# Patient Record
Sex: Female | Born: 1940
Health system: Southern US, Community
[De-identification: ages and names within clinical notes are randomized; demographics above are authoritative.]

## PROBLEM LIST (undated history)

## (undated) DIAGNOSIS — D869 Sarcoidosis, unspecified: Secondary | ICD-10-CM

## (undated) DIAGNOSIS — R42 Dizziness and giddiness: Secondary | ICD-10-CM

## (undated) DIAGNOSIS — R0600 Dyspnea, unspecified: Secondary | ICD-10-CM

## (undated) DIAGNOSIS — R55 Syncope and collapse: Secondary | ICD-10-CM

## (undated) DIAGNOSIS — G9389 Other specified disorders of brain: Secondary | ICD-10-CM

## (undated) DIAGNOSIS — M1990 Unspecified osteoarthritis, unspecified site: Secondary | ICD-10-CM

## (undated) DIAGNOSIS — K219 Gastro-esophageal reflux disease without esophagitis: Secondary | ICD-10-CM

## (undated) DIAGNOSIS — E785 Hyperlipidemia, unspecified: Secondary | ICD-10-CM

## (undated) DIAGNOSIS — I10 Essential (primary) hypertension: Secondary | ICD-10-CM

## (undated) DIAGNOSIS — N183 Chronic kidney disease, stage 3 unspecified: Secondary | ICD-10-CM

## (undated) HISTORY — DX: Chronic kidney disease, stage 3 unspecified: N18.30

## (undated) HISTORY — DX: Dizziness and giddiness: R42

## (undated) HISTORY — PX: TONSILLECTOMY: SUR1361

## (undated) HISTORY — DX: Syncope and collapse: R55

## (undated) HISTORY — DX: Essential (primary) hypertension: I10

## (undated) HISTORY — DX: Sarcoidosis, unspecified: D86.9

## (undated) HISTORY — PX: ABDOMINAL HYSTERECTOMY: SHX81

## (undated) HISTORY — PX: RADICAL HYSTERECTOMY WITH TRANSPOSITION OF OVARIES: SHX6222

## (undated) HISTORY — DX: Hyperlipidemia, unspecified: E78.5

## (undated) HISTORY — DX: Unspecified osteoarthritis, unspecified site: M19.90

## (undated) HISTORY — PX: SHOULDER ARTHROSCOPY: SHX128

## (undated) HISTORY — DX: Other specified disorders of brain: G93.89

## (undated) HISTORY — DX: Gastro-esophageal reflux disease without esophagitis: K21.9

## (undated) HISTORY — PX: KNEE ARTHROSCOPY: SUR90

---

## 1997-06-20 ENCOUNTER — Ambulatory Visit (HOSPITAL_COMMUNITY): Admission: RE | Admit: 1997-06-20 | Discharge: 1997-06-20 | Payer: Self-pay | Admitting: *Deleted

## 1998-02-17 DIAGNOSIS — I499 Cardiac arrhythmia, unspecified: Secondary | ICD-10-CM

## 1998-02-17 HISTORY — DX: Cardiac arrhythmia, unspecified: I49.9

## 1998-02-20 ENCOUNTER — Emergency Department (HOSPITAL_COMMUNITY): Admission: EM | Admit: 1998-02-20 | Discharge: 1998-02-20 | Payer: Self-pay | Admitting: Emergency Medicine

## 1998-09-28 ENCOUNTER — Other Ambulatory Visit: Admission: RE | Admit: 1998-09-28 | Discharge: 1998-09-28 | Payer: Self-pay | Admitting: Obstetrics & Gynecology

## 1999-10-24 ENCOUNTER — Other Ambulatory Visit: Admission: RE | Admit: 1999-10-24 | Discharge: 1999-10-24 | Payer: Self-pay | Admitting: Obstetrics & Gynecology

## 1999-10-25 ENCOUNTER — Other Ambulatory Visit: Admission: RE | Admit: 1999-10-25 | Discharge: 1999-10-25 | Payer: Self-pay | Admitting: Obstetrics & Gynecology

## 1999-10-25 ENCOUNTER — Encounter (INDEPENDENT_AMBULATORY_CARE_PROVIDER_SITE_OTHER): Payer: Self-pay

## 2000-09-30 ENCOUNTER — Other Ambulatory Visit: Admission: RE | Admit: 2000-09-30 | Discharge: 2000-09-30 | Payer: Self-pay | Admitting: Obstetrics and Gynecology

## 2001-05-22 ENCOUNTER — Ambulatory Visit (HOSPITAL_COMMUNITY): Admission: RE | Admit: 2001-05-22 | Discharge: 2001-05-22 | Payer: Self-pay | Admitting: Orthopedic Surgery

## 2001-05-22 ENCOUNTER — Encounter: Payer: Self-pay | Admitting: Orthopedic Surgery

## 2001-07-08 ENCOUNTER — Encounter: Payer: Self-pay | Admitting: Orthopedic Surgery

## 2001-07-13 ENCOUNTER — Ambulatory Visit (HOSPITAL_COMMUNITY): Admission: RE | Admit: 2001-07-13 | Discharge: 2001-07-13 | Payer: Self-pay | Admitting: Orthopedic Surgery

## 2001-07-30 ENCOUNTER — Encounter: Admission: RE | Admit: 2001-07-30 | Discharge: 2001-08-23 | Payer: Self-pay | Admitting: Orthopedic Surgery

## 2001-10-14 ENCOUNTER — Other Ambulatory Visit: Admission: RE | Admit: 2001-10-14 | Discharge: 2001-10-14 | Payer: Self-pay | Admitting: Obstetrics and Gynecology

## 2002-11-24 ENCOUNTER — Ambulatory Visit (HOSPITAL_COMMUNITY): Admission: RE | Admit: 2002-11-24 | Discharge: 2002-11-24 | Payer: Self-pay | Admitting: *Deleted

## 2002-11-24 ENCOUNTER — Encounter: Payer: Self-pay | Admitting: *Deleted

## 2003-08-01 ENCOUNTER — Ambulatory Visit (HOSPITAL_COMMUNITY): Admission: RE | Admit: 2003-08-01 | Discharge: 2003-08-01 | Payer: Self-pay | Admitting: Orthopedic Surgery

## 2003-08-16 ENCOUNTER — Encounter: Admission: RE | Admit: 2003-08-16 | Discharge: 2003-08-31 | Payer: Self-pay | Admitting: Orthopedic Surgery

## 2003-11-27 ENCOUNTER — Other Ambulatory Visit: Admission: RE | Admit: 2003-11-27 | Discharge: 2003-11-27 | Payer: Self-pay | Admitting: Obstetrics and Gynecology

## 2004-12-09 ENCOUNTER — Other Ambulatory Visit: Admission: RE | Admit: 2004-12-09 | Discharge: 2004-12-09 | Payer: Self-pay | Admitting: Obstetrics and Gynecology

## 2005-12-11 ENCOUNTER — Other Ambulatory Visit: Admission: RE | Admit: 2005-12-11 | Discharge: 2005-12-11 | Payer: Self-pay | Admitting: Obstetrics and Gynecology

## 2006-03-06 ENCOUNTER — Encounter: Admission: RE | Admit: 2006-03-06 | Discharge: 2006-03-06 | Payer: Self-pay | Admitting: Orthopedic Surgery

## 2007-10-22 ENCOUNTER — Ambulatory Visit (HOSPITAL_COMMUNITY): Admission: RE | Admit: 2007-10-22 | Discharge: 2007-10-22 | Payer: Self-pay | Admitting: Pulmonary Disease

## 2009-01-05 ENCOUNTER — Encounter: Admission: RE | Admit: 2009-01-05 | Discharge: 2009-01-05 | Payer: Self-pay | Admitting: Orthopedic Surgery

## 2010-03-11 ENCOUNTER — Encounter
Admission: RE | Admit: 2010-03-11 | Discharge: 2010-03-11 | Payer: Self-pay | Source: Home / Self Care | Attending: Orthopedic Surgery | Admitting: Orthopedic Surgery

## 2010-07-05 NOTE — H&P (Signed)
[place]. [facility]  Patient:    Tina Clements, Tina Clements Visit Number: [id number] MRN: [id number]          Service Type: DSU Location: RCRM 2550 04 Attending Physician:  Wende Mott Dictated by:   Kennieth Rad, M.D. Admit Date:  07/13/2001 Discharge Date: 07/13/2001                           History and Physical  CHIEF COMPLAINT:  Painful right shoulder.  HISTORY OF PRESENT ILLNESS:  This is a 70 year old who had been treated for impingement syndrome and subacromial bursitis involving her right shoulder with possible rotator cuff tear over the past several months.  The patient had done fairly well with therapeutic injections and anti-inflammatories for a while, but symptoms progressively worsened with pain and increased weakness and difficulty in moving the arm.  PAST MEDICAL HISTORY:  Hysterectomy.  No history of high blood pressure or diabetes.  ALLERGIES:  E-MYCIN causes headaches.  MEDICATIONS:  Estrace, vitamin E, Allegra and Bextra.  FAMILY HISTORY:  Noncontributory.  REVIEW OF SYSTEMS:  Basically as in history of present illness.  No cardiac or respiratory, no urinary or bowel symptoms.  PHYSICAL EXAMINATION:  VITAL SIGNS:  Temperature 98.5, pulse 70, respirations 16, blood pressure 150/90.  Height 5 feet 6 inches.  Weight 168.  HEENT:  Normocephalic.  Eyes:  Conjunctivae and sclerae clear.  NECK:  Supple.  CHEST:  Clear.  CARDIAC:  S1 and S2 regular.  EXTREMITIES:  Right shoulder tender, anterior and lateral, with subacromial crepitus, pain on abduction at 75 degrees and external rotation.  Good grip. Good pinch.  Increased weakness following resisted abduction.  IMPRESSION:  Impingement syndrome and subacromial bursitis, right shoulder, possible rotator cuff tear. Dictated by:   Kennieth Rad, M.D. Attending Physician:  Wende Mott DD:  08/24/01 TD:  08/26/01 Job: [id number]

## 2010-07-05 NOTE — Op Note (Signed)
[place]. [facility]  Patient:    GIDGET, QUIZHPI Visit Number: [id number] MRN: [id number]          Service Type: DSU Location: RCRM 2550 04 Attending Physician:  Wende Mott Dictated by:   Kennieth Rad, M.D. Proc. Date: 09/12/01 Admit Date:  07/13/2001 Discharge Date: 07/13/2001                             Operative Report  INCOMPLETE  PREOPERATIVE DIAGNOSIS:  Impingement syndrome and subacromial bursitis, left shoulder.  POSTOPERATIVE DIAGNOSIS:  Impingement syndrome and subacromial bursitis, left shoulder.  PROCEDURE:  Arthroscopic acromioplasty and synovectomy, left shoulder.  SURGEON:  Kennieth Rad, M.D.  ANESTHESIA:  General.  DESCRIPTION OF PROCEDURE:  The patient was taken to the operating room and given adequate Dictated by:   Kennieth Rad, M.D. Attending Physician:  Wende Mott DD:  08/24/01 TD:  08/26/01 Job: [id number]

## 2010-07-05 NOTE — Op Note (Signed)
[place]. [facility]  Patient:    Tina Clements, Tina Clements Visit Number: [id number] MRN: [id number]          Service Type: DSU Location: RCRM 2550 04 Attending Physician:  Wende Mott Dictated by:   Kennieth Rad, M.D. Proc. Date: 07/13/01 Admit Date:  07/13/2001 Discharge Date: 07/13/2001                             Operative Report  PREOPERATIVE DIAGNOSES:  Impingement syndrome, subacromial bursitis, possible rotator cuff tear.  POSTOPERATIVE DIAGNOSIS:  Impingement syndrome with subacromial bursitis, right shoulder.  PROCEDURE:  Arthroscopic acromioplasty, decompression and synovectomy of right shoulder.  SURGEON:  Kennieth Rad, M.D.  ANESTHESIA:  General.  DESCRIPTION OF PROCEDURE:  The patient was taken to the operating room after giving adequate preop medication and given general anesthesia and intubated. The patient was placed in a barber chair position.  Right shoulder was prepped with DuraPrep and draped in a sterile manner.  One-half-inch puncture wound was made posteriorly over the shoulder.  ______  rod was placed from posterior to anterior for the anterior inflow water.  A third incision was made laterally.  With the scope placed posterior-to-anterior, hypertrophic overgrowth of the subacromial bursal sac was noted with eburnation and chondromalacic changes of the subacromial surface.  Rotator cuff had some surface damage to it but no through-and-through tear.  With a synovial shaver, complete synovectomy was done and removal of chondromalacic surface.  Next, with the use a bur, chondroplasty was then done and decompression of the joint.  Further copious irrigation and debridement were done.  Rotator cuff was still intact.  Copious and abundant irrigation was done followed by wound closure with 4-0 nylon and injection of 0.5% Marcaine with epinephrine. Compressive dressing was applied, immobilizing sling applied.  The  patient tolerated the procedure quite well and went to recovery room in stable and satisfactory condition.  The patient is being discharged on Percocet one q.4h. p.r.n. for pain, ice packs and to return to the office in one week.  The patient is being discharged in stable and satisfactory condition.Dictated by:   Kennieth Rad, M.D. Attending Physician:  Wende Mott DD:  08/24/01 TD:  08/26/01 Job: [id number]

## 2010-07-05 NOTE — Op Note (Signed)
NAME:  Tina Clements, Tina Clements                       ACCOUNT NO.:  [id number]   MEDICAL RECORD NO.:  [id number]                   PATIENT TYPE:  OIB   LOCATION:  2550                                 FACILITY:  [facility]   PHYSICIAN:  Myrtie Neither, M.D.                 DATE OF BIRTH:  1941/02/14   DATE OF PROCEDURE:  08/01/2003  DATE OF DISCHARGE:  08/01/2003                                 OPERATIVE REPORT   PREOPERATIVE DIAGNOSES:  Internal derangement and lateral meniscal tear,  right knee.   POSTOPERATIVE DIAGNOSES:  Lateral meniscal tear, chondral defect of lateral  femoral condyle, and plica with chronic synovitis, right knee.   ANESTHESIA:  General.   PROCEDURES:  Arthroscopic lateral meniscectomy, chondroplasty of lateral  femoral condyle, synovectomy, and excision of plica of both medial and  lateral compartments.   DESCRIPTION OF PROCEDURE:  The patient was taken to the operating room after  being given adequate preoperative medication, given general anesthesia, and  intubated.  The right knee was prepped with Duraprep and draped in sterile  manner.  Tourniquet used for hemostasis.  A 1/2-inch puncture wound was made  along the anteromedial and lateral joint line.  Inflow of water through the  medial suprapatellar pouch area.  Inspection of the joint revealed the  severely torn lateral meniscus along the posterior horn and posterolaterally  with loose body.  It had created a chondral defect at the lateral femoral  condyle and had thickened synovium in both medial and lateral compartments  with a very large plica.  ACL and PCL were intact.  The medial compartment  was fairly well preserved.  With the synovial shaver, synovectomy was done  with excision of the plica.  With the basket forceps, lateral meniscectomy  was done and this was further smoothed with the use of meniscal shaver.  Chondroplasty was then done with the meniscal shaver and smoothed down with  the rasp.  Copious  irrigation was done.  Wound closure was then done with 4-  0 nylon.  Marcaine 0.25% plain was injected into the knee.  A pressure  dressing was applied.  A knee immobilizer was applied.  The patient  tolerated the procedure quite well and went to the recovery room in stable  and satisfactory condition.  The patient was able to be discharged home on  Percocet one to two q.4h. p.r.n. for pain, ice packs, and nonweightbearing  on the right side with use of walker or crutches.  To return to the office  in 10 days.                                               Myrtie Neither, M.D.    AC/MEDQ  D:  08/22/2003  T:  08/22/2003  Job:  [id number]

## 2010-07-05 NOTE — H&P (Signed)
NAME:  Tina Clements, Tina Clements                       ACCOUNT NO.:  [id number]   MEDICAL RECORD NO.:  [id number]                   PATIENT TYPE:  OIB   LOCATION:  2550                                 FACILITY:  [facility]   PHYSICIAN:  Myrtie Neither, M.D.                 DATE OF BIRTH:  [birth year]   DATE OF ADMISSION:  08/01/2003  DATE OF DISCHARGE:  08/01/2003                                HISTORY & PHYSICAL   CHIEF COMPLAINT:  Painful locking right knee.   HISTORY OF PRESENT ILLNESS:  This is a 70 year old black female who has been  followed in the office for the past several months for internal derangement  of her right knee.  The patient had been treated with anti-inflammatories  and aquatic exercises.  She had some improvement, but the patient developed  locking and catching, more frequent onset with pain, and feeling of giving  way in the right knee.  The patient had an MRI done which demonstrated  lateral meniscal tear of her right knee.   PAST MEDICAL HISTORY:  1. Right rotator cuff repair two years ago.  2. Hysterectomy three years ago.   No history of high blood pressure or diabetes.   SOCIAL HISTORY:  Occasional use of alcohol.  No history of use of tobacco.   FAMILY HISTORY:  Noncontributory.   REVIEW OF SYSTEMS:  Basically that of the history of present illness.  No  cardiac, respiratory, urinary, or bowel symptoms.   ALLERGIES:  1. ERYTHROMYCIN.  2. STRAWBERRIES.   MEDICATIONS:  1. Zyrtec 10 mg.  2. Vitamin D 400 units.  3. Clinoril 200 mg b.i.d.  4. Estradiol 1 mg daily.  5. Tylenol p.r.n.   FAMILY HISTORY:  Noncontributory.   PHYSICAL EXAMINATION:  VITAL SIGNS:  Temperature 96.9 degrees, pulse 58,  respirations 20, blood pressure 162/78.  HEIGHT:  63-3/4 inches.  WEIGHT:  171 pounds.  HEENT:  Head:  Normocephalic.  Eyes:  Conjunctivae and sclerae clear.  NECK:  Supple  CHEST:  Clear.  CARDIAC:  S1 and S2 regular.  EXTREMITIES:  Right knee tender anteriorly  and laterally.  Positive  McMurray's test.  Palpable and audible click.  Range of motion is good.  Pain on full flexion of the right knee on the posterolateral aspect.  Negative drawers.  Negative Lachman's test.   IMPRESSION:  1. Lateral meniscal tear.  2. Internal derangement, right knee.   PLAN:  Arthroscopy of right knee.                                                Myrtie Neither, M.D.    AC/MEDQ  D:  08/22/2003  T:  08/22/2003  Job:  [id number]

## 2010-11-25 ENCOUNTER — Ambulatory Visit
Admission: RE | Admit: 2010-11-25 | Discharge: 2010-11-25 | Disposition: A | Discharge status: Home / Self Care | Payer: Medicare Other | Source: Ambulatory Visit | Attending: Orthopedic Surgery | Admitting: Orthopedic Surgery

## 2010-11-25 ENCOUNTER — Other Ambulatory Visit: Payer: Self-pay | Admitting: Orthopedic Surgery

## 2010-11-25 DIAGNOSIS — M5430 Sciatica, unspecified side: Secondary | ICD-10-CM

## 2011-03-04 DIAGNOSIS — Z79899 Other long term (current) drug therapy: Secondary | ICD-10-CM | POA: Diagnosis not present

## 2011-03-04 DIAGNOSIS — J99 Respiratory disorders in diseases classified elsewhere: Secondary | ICD-10-CM | POA: Diagnosis not present

## 2011-03-04 DIAGNOSIS — E7800 Pure hypercholesterolemia, unspecified: Secondary | ICD-10-CM | POA: Diagnosis not present

## 2011-03-04 DIAGNOSIS — Z23 Encounter for immunization: Secondary | ICD-10-CM | POA: Diagnosis not present

## 2011-03-21 DIAGNOSIS — M1710 Unilateral primary osteoarthritis, unspecified knee: Secondary | ICD-10-CM | POA: Diagnosis not present

## 2011-05-01 DIAGNOSIS — D129 Benign neoplasm of anus and anal canal: Secondary | ICD-10-CM | POA: Diagnosis not present

## 2011-05-01 DIAGNOSIS — Z1211 Encounter for screening for malignant neoplasm of colon: Secondary | ICD-10-CM | POA: Diagnosis not present

## 2011-05-01 DIAGNOSIS — D128 Benign neoplasm of rectum: Secondary | ICD-10-CM | POA: Diagnosis not present

## 2011-05-01 DIAGNOSIS — K648 Other hemorrhoids: Secondary | ICD-10-CM | POA: Diagnosis not present

## 2011-05-01 DIAGNOSIS — D126 Benign neoplasm of colon, unspecified: Secondary | ICD-10-CM | POA: Diagnosis not present

## 2011-06-19 DIAGNOSIS — E7800 Pure hypercholesterolemia, unspecified: Secondary | ICD-10-CM | POA: Diagnosis not present

## 2011-06-19 DIAGNOSIS — Z79899 Other long term (current) drug therapy: Secondary | ICD-10-CM | POA: Diagnosis not present

## 2011-06-19 DIAGNOSIS — J99 Respiratory disorders in diseases classified elsewhere: Secondary | ICD-10-CM | POA: Diagnosis not present

## 2011-06-24 DIAGNOSIS — M1710 Unilateral primary osteoarthritis, unspecified knee: Secondary | ICD-10-CM | POA: Diagnosis not present

## 2011-06-27 ENCOUNTER — Ambulatory Visit
Admission: RE | Admit: 2011-06-27 | Discharge: 2011-06-27 | Disposition: A | Discharge status: Home / Self Care | Payer: Medicare Other | Source: Ambulatory Visit | Attending: Orthopedic Surgery | Admitting: Orthopedic Surgery

## 2011-06-27 ENCOUNTER — Other Ambulatory Visit: Payer: Self-pay | Admitting: Orthopedic Surgery

## 2011-06-27 DIAGNOSIS — M7120 Synovial cyst of popliteal space [Baker], unspecified knee: Secondary | ICD-10-CM

## 2011-06-27 DIAGNOSIS — M1710 Unilateral primary osteoarthritis, unspecified knee: Secondary | ICD-10-CM | POA: Diagnosis not present

## 2011-06-27 DIAGNOSIS — M25569 Pain in unspecified knee: Secondary | ICD-10-CM | POA: Diagnosis not present

## 2011-06-27 DIAGNOSIS — R52 Pain, unspecified: Secondary | ICD-10-CM

## 2011-07-22 DIAGNOSIS — M1710 Unilateral primary osteoarthritis, unspecified knee: Secondary | ICD-10-CM | POA: Diagnosis not present

## 2011-09-24 DIAGNOSIS — Z1231 Encounter for screening mammogram for malignant neoplasm of breast: Secondary | ICD-10-CM | POA: Diagnosis not present

## 2011-10-30 DIAGNOSIS — M25519 Pain in unspecified shoulder: Secondary | ICD-10-CM | POA: Diagnosis not present

## 2011-10-30 DIAGNOSIS — D869 Sarcoidosis, unspecified: Secondary | ICD-10-CM | POA: Diagnosis not present

## 2011-10-30 DIAGNOSIS — E7800 Pure hypercholesterolemia, unspecified: Secondary | ICD-10-CM | POA: Diagnosis not present

## 2011-10-30 DIAGNOSIS — M810 Age-related osteoporosis without current pathological fracture: Secondary | ICD-10-CM | POA: Diagnosis not present

## 2011-10-30 DIAGNOSIS — Z79899 Other long term (current) drug therapy: Secondary | ICD-10-CM | POA: Diagnosis not present

## 2011-11-18 DIAGNOSIS — Z23 Encounter for immunization: Secondary | ICD-10-CM | POA: Diagnosis not present

## 2011-11-18 DIAGNOSIS — E7800 Pure hypercholesterolemia, unspecified: Secondary | ICD-10-CM | POA: Diagnosis not present

## 2011-11-18 DIAGNOSIS — D869 Sarcoidosis, unspecified: Secondary | ICD-10-CM | POA: Diagnosis not present

## 2011-11-18 DIAGNOSIS — Z79899 Other long term (current) drug therapy: Secondary | ICD-10-CM | POA: Diagnosis not present

## 2011-11-18 DIAGNOSIS — Z8249 Family history of ischemic heart disease and other diseases of the circulatory system: Secondary | ICD-10-CM | POA: Diagnosis not present

## 2011-11-19 DIAGNOSIS — M1710 Unilateral primary osteoarthritis, unspecified knee: Secondary | ICD-10-CM | POA: Diagnosis not present

## 2012-01-21 ENCOUNTER — Ambulatory Visit (INDEPENDENT_AMBULATORY_CARE_PROVIDER_SITE_OTHER): Payer: Medicare Other | Admitting: Obstetrics and Gynecology

## 2012-01-21 ENCOUNTER — Encounter: Payer: Self-pay | Admitting: Obstetrics and Gynecology

## 2012-01-21 VITALS — BP 134/60 | Ht 64.75 in | Wt 174.0 lb

## 2012-01-21 DIAGNOSIS — Z124 Encounter for screening for malignant neoplasm of cervix: Secondary | ICD-10-CM | POA: Diagnosis not present

## 2012-01-21 DIAGNOSIS — Z8739 Personal history of other diseases of the musculoskeletal system and connective tissue: Secondary | ICD-10-CM | POA: Insufficient documentation

## 2012-01-21 DIAGNOSIS — Z0000 Encounter for general adult medical examination without abnormal findings: Secondary | ICD-10-CM | POA: Diagnosis not present

## 2012-01-21 DIAGNOSIS — D869 Sarcoidosis, unspecified: Secondary | ICD-10-CM | POA: Insufficient documentation

## 2012-01-21 DIAGNOSIS — M949 Disorder of cartilage, unspecified: Secondary | ICD-10-CM | POA: Diagnosis not present

## 2012-01-21 DIAGNOSIS — M899 Disorder of bone, unspecified: Secondary | ICD-10-CM | POA: Diagnosis not present

## 2012-01-21 DIAGNOSIS — E785 Hyperlipidemia, unspecified: Secondary | ICD-10-CM | POA: Insufficient documentation

## 2012-01-21 DIAGNOSIS — Z8639 Personal history of other endocrine, nutritional and metabolic disease: Secondary | ICD-10-CM | POA: Insufficient documentation

## 2012-01-21 MED ORDER — ESTROGENS, CONJUGATED 0.625 MG/GM VA CREA: TOPICAL_CREAM | VAGINAL | Status: DC

## 2012-01-21 NOTE — Progress Notes (Signed)
Last Pap: 01/15/2010 WNL: Yes Regular Periods: HYsterectomy Contraception:   Monthly Breast exam:yes Tetanus<34yrs:yes Nl.Bladder Function:yes Daily BMs:yes Healthy Diet:yes Calcium:yes Mammogram:yes Date of Mammogram: 09/25/2011 Normal Exercise:yes Have often Exercise: 3 times week  Seatbelt: yes Abuse at home: no Stressful work:no Retired  Sigmoid-colonoscopy: 04/2011 polyps x 2  Bone Density: Yes 2010 with osteopenia PCP: Dr Corine Shelter Change in PMH: none  Change in BJY:NWGN   No complaints.  Wants refill on estrogen vag cream.  She uses q 2wks or so.  Filed Vitals:   01/21/12 0944  BP: 134/60   ROS: noncontributory  Physical Examination: General appearance - alert, well appearing, and in no distress Neck - supple, no significant adenopathy Chest - clear to auscultation, no wheezes, rales or rhonchi, symmetric air entry Heart - normal rate and regular rhythm Abdomen - soft, nontender, nondistended, no masses or organomegaly Breasts - breasts appear normal, no suspicious masses, no skin or nipple changes or axillary nodes Pelvic - normal external genitalia, vulva, vagina, no pelvic masses Back exam - no CVAT Extremities - no edema, redness or tenderness in the calves or thighs Rectal exam - no masses  A/P BDS today- h/o osteopenia Recheck Vit D (h/o deficiency) Had nl mammo in August

## 2012-01-22 LAB — VITAMIN D 25 HYDROXY (VIT D DEFICIENCY, FRACTURES): Vit D, 25-Hydroxy: 18 ng/mL — ABNORMAL LOW (ref 30–89)

## 2012-01-27 DIAGNOSIS — M719 Bursopathy, unspecified: Secondary | ICD-10-CM | POA: Diagnosis not present

## 2012-02-17 ENCOUNTER — Telehealth: Payer: Self-pay

## 2012-02-17 NOTE — Telephone Encounter (Signed)
LM for pt to cb for test results. Tina Clements, [name]  

## 2012-02-19 ENCOUNTER — Telehealth: Payer: Self-pay

## 2012-02-19 ENCOUNTER — Other Ambulatory Visit: Payer: Self-pay

## 2012-02-19 DIAGNOSIS — E559 Vitamin D deficiency, unspecified: Secondary | ICD-10-CM

## 2012-02-19 NOTE — Telephone Encounter (Signed)
Spoke to pt to notify her of need for Vit D protocol. Recall entered and future order put in. Called Vit D softgels 50,000 units twice weekly x 8 weeks # 16 w/ No RF  To the [facility] at [address] Melody Comas A

## 2012-03-04 DIAGNOSIS — E7800 Pure hypercholesterolemia, unspecified: Secondary | ICD-10-CM | POA: Diagnosis not present

## 2012-03-04 DIAGNOSIS — Z8249 Family history of ischemic heart disease and other diseases of the circulatory system: Secondary | ICD-10-CM | POA: Diagnosis not present

## 2012-03-04 DIAGNOSIS — E559 Vitamin D deficiency, unspecified: Secondary | ICD-10-CM | POA: Diagnosis not present

## 2012-03-04 DIAGNOSIS — M47812 Spondylosis without myelopathy or radiculopathy, cervical region: Secondary | ICD-10-CM | POA: Diagnosis not present

## 2012-03-04 DIAGNOSIS — D869 Sarcoidosis, unspecified: Secondary | ICD-10-CM | POA: Diagnosis not present

## 2012-03-04 DIAGNOSIS — M542 Cervicalgia: Secondary | ICD-10-CM | POA: Diagnosis not present

## 2012-03-04 DIAGNOSIS — Z79899 Other long term (current) drug therapy: Secondary | ICD-10-CM | POA: Diagnosis not present

## 2012-03-16 ENCOUNTER — Ambulatory Visit (HOSPITAL_COMMUNITY)
Admission: RE | Admit: 2012-03-16 | Discharge: 2012-03-16 | Disposition: A | Discharge status: Home / Self Care | Payer: Medicare Other | Source: Ambulatory Visit | Attending: Pulmonary Disease | Admitting: Pulmonary Disease

## 2012-03-16 ENCOUNTER — Other Ambulatory Visit (HOSPITAL_COMMUNITY): Payer: Self-pay | Admitting: Pulmonary Disease

## 2012-03-16 DIAGNOSIS — I700 Atherosclerosis of aorta: Secondary | ICD-10-CM | POA: Insufficient documentation

## 2012-03-16 DIAGNOSIS — M542 Cervicalgia: Secondary | ICD-10-CM

## 2012-03-16 DIAGNOSIS — M47812 Spondylosis without myelopathy or radiculopathy, cervical region: Secondary | ICD-10-CM | POA: Diagnosis not present

## 2012-03-16 DIAGNOSIS — G8929 Other chronic pain: Secondary | ICD-10-CM | POA: Diagnosis not present

## 2012-03-16 DIAGNOSIS — M79609 Pain in unspecified limb: Secondary | ICD-10-CM | POA: Diagnosis not present

## 2012-05-04 ENCOUNTER — Other Ambulatory Visit: Payer: Self-pay | Admitting: Orthopedic Surgery

## 2012-05-04 DIAGNOSIS — M47812 Spondylosis without myelopathy or radiculopathy, cervical region: Secondary | ICD-10-CM | POA: Diagnosis not present

## 2012-05-04 DIAGNOSIS — M542 Cervicalgia: Secondary | ICD-10-CM

## 2012-05-19 ENCOUNTER — Other Ambulatory Visit: Payer: Medicare Other

## 2012-05-27 ENCOUNTER — Ambulatory Visit
Admission: RE | Admit: 2012-05-27 | Discharge: 2012-05-27 | Disposition: A | Discharge status: Home / Self Care | Payer: Medicare Other | Source: Ambulatory Visit | Attending: Orthopedic Surgery | Admitting: Orthopedic Surgery

## 2012-05-27 DIAGNOSIS — M47812 Spondylosis without myelopathy or radiculopathy, cervical region: Secondary | ICD-10-CM | POA: Diagnosis not present

## 2012-05-27 DIAGNOSIS — M5020 Other cervical disc displacement, unspecified cervical region: Secondary | ICD-10-CM | POA: Diagnosis not present

## 2012-05-27 DIAGNOSIS — M542 Cervicalgia: Secondary | ICD-10-CM

## 2012-05-27 DIAGNOSIS — M4802 Spinal stenosis, cervical region: Secondary | ICD-10-CM | POA: Diagnosis not present

## 2012-05-28 DIAGNOSIS — M5412 Radiculopathy, cervical region: Secondary | ICD-10-CM | POA: Diagnosis not present

## 2012-06-15 DIAGNOSIS — M5030 Other cervical disc degeneration, unspecified cervical region: Secondary | ICD-10-CM | POA: Diagnosis not present

## 2012-06-22 DIAGNOSIS — D869 Sarcoidosis, unspecified: Secondary | ICD-10-CM | POA: Diagnosis not present

## 2012-06-22 DIAGNOSIS — J309 Allergic rhinitis, unspecified: Secondary | ICD-10-CM | POA: Diagnosis not present

## 2012-06-22 DIAGNOSIS — M5137 Other intervertebral disc degeneration, lumbosacral region: Secondary | ICD-10-CM | POA: Diagnosis not present

## 2012-06-22 DIAGNOSIS — E7800 Pure hypercholesterolemia, unspecified: Secondary | ICD-10-CM | POA: Diagnosis not present

## 2012-07-28 DIAGNOSIS — IMO0002 Reserved for concepts with insufficient information to code with codable children: Secondary | ICD-10-CM | POA: Diagnosis not present

## 2012-08-18 DIAGNOSIS — IMO0002 Reserved for concepts with insufficient information to code with codable children: Secondary | ICD-10-CM | POA: Diagnosis not present

## 2012-09-24 DIAGNOSIS — Z1231 Encounter for screening mammogram for malignant neoplasm of breast: Secondary | ICD-10-CM | POA: Diagnosis not present

## 2012-10-20 DIAGNOSIS — M5412 Radiculopathy, cervical region: Secondary | ICD-10-CM | POA: Diagnosis not present

## 2012-11-17 DIAGNOSIS — M1710 Unilateral primary osteoarthritis, unspecified knee: Secondary | ICD-10-CM | POA: Diagnosis not present

## 2012-11-23 DIAGNOSIS — E7800 Pure hypercholesterolemia, unspecified: Secondary | ICD-10-CM | POA: Diagnosis not present

## 2012-11-23 DIAGNOSIS — J309 Allergic rhinitis, unspecified: Secondary | ICD-10-CM | POA: Diagnosis not present

## 2012-11-23 DIAGNOSIS — Z23 Encounter for immunization: Secondary | ICD-10-CM | POA: Diagnosis not present

## 2012-11-23 DIAGNOSIS — E559 Vitamin D deficiency, unspecified: Secondary | ICD-10-CM | POA: Diagnosis not present

## 2012-11-23 DIAGNOSIS — D869 Sarcoidosis, unspecified: Secondary | ICD-10-CM | POA: Diagnosis not present

## 2012-11-23 DIAGNOSIS — Z79899 Other long term (current) drug therapy: Secondary | ICD-10-CM | POA: Diagnosis not present

## 2013-01-19 DIAGNOSIS — M25519 Pain in unspecified shoulder: Secondary | ICD-10-CM | POA: Diagnosis not present

## 2013-01-27 DIAGNOSIS — Z01419 Encounter for gynecological examination (general) (routine) without abnormal findings: Secondary | ICD-10-CM | POA: Diagnosis not present

## 2013-01-27 DIAGNOSIS — N952 Postmenopausal atrophic vaginitis: Secondary | ICD-10-CM | POA: Diagnosis not present

## 2013-03-01 DIAGNOSIS — E559 Vitamin D deficiency, unspecified: Secondary | ICD-10-CM | POA: Diagnosis not present

## 2013-03-01 DIAGNOSIS — M542 Cervicalgia: Secondary | ICD-10-CM | POA: Diagnosis not present

## 2013-03-01 DIAGNOSIS — M47812 Spondylosis without myelopathy or radiculopathy, cervical region: Secondary | ICD-10-CM | POA: Diagnosis not present

## 2013-03-01 DIAGNOSIS — J309 Allergic rhinitis, unspecified: Secondary | ICD-10-CM | POA: Diagnosis not present

## 2013-03-01 DIAGNOSIS — E7800 Pure hypercholesterolemia, unspecified: Secondary | ICD-10-CM | POA: Diagnosis not present

## 2013-03-01 DIAGNOSIS — Z79899 Other long term (current) drug therapy: Secondary | ICD-10-CM | POA: Diagnosis not present

## 2013-03-01 DIAGNOSIS — D869 Sarcoidosis, unspecified: Secondary | ICD-10-CM | POA: Diagnosis not present

## 2013-03-04 DIAGNOSIS — M67919 Unspecified disorder of synovium and tendon, unspecified shoulder: Secondary | ICD-10-CM | POA: Diagnosis not present

## 2013-05-02 DIAGNOSIS — IMO0002 Reserved for concepts with insufficient information to code with codable children: Secondary | ICD-10-CM | POA: Diagnosis not present

## 2013-05-02 DIAGNOSIS — M1710 Unilateral primary osteoarthritis, unspecified knee: Secondary | ICD-10-CM | POA: Diagnosis not present

## 2013-06-13 DIAGNOSIS — M719 Bursopathy, unspecified: Secondary | ICD-10-CM | POA: Diagnosis not present

## 2013-06-13 DIAGNOSIS — M67919 Unspecified disorder of synovium and tendon, unspecified shoulder: Secondary | ICD-10-CM | POA: Diagnosis not present

## 2013-06-28 DIAGNOSIS — Z8249 Family history of ischemic heart disease and other diseases of the circulatory system: Secondary | ICD-10-CM | POA: Diagnosis not present

## 2013-06-28 DIAGNOSIS — D869 Sarcoidosis, unspecified: Secondary | ICD-10-CM | POA: Diagnosis not present

## 2013-06-28 DIAGNOSIS — M1990 Unspecified osteoarthritis, unspecified site: Secondary | ICD-10-CM | POA: Diagnosis not present

## 2013-06-28 DIAGNOSIS — E7800 Pure hypercholesterolemia, unspecified: Secondary | ICD-10-CM | POA: Diagnosis not present

## 2013-08-08 DIAGNOSIS — M1710 Unilateral primary osteoarthritis, unspecified knee: Secondary | ICD-10-CM | POA: Diagnosis not present

## 2013-08-08 DIAGNOSIS — IMO0002 Reserved for concepts with insufficient information to code with codable children: Secondary | ICD-10-CM | POA: Diagnosis not present

## 2013-08-23 DIAGNOSIS — E7800 Pure hypercholesterolemia, unspecified: Secondary | ICD-10-CM | POA: Diagnosis not present

## 2013-08-23 DIAGNOSIS — E559 Vitamin D deficiency, unspecified: Secondary | ICD-10-CM | POA: Diagnosis not present

## 2013-08-23 DIAGNOSIS — D869 Sarcoidosis, unspecified: Secondary | ICD-10-CM | POA: Diagnosis not present

## 2013-08-23 DIAGNOSIS — Z79899 Other long term (current) drug therapy: Secondary | ICD-10-CM | POA: Diagnosis not present

## 2013-08-23 DIAGNOSIS — Z8249 Family history of ischemic heart disease and other diseases of the circulatory system: Secondary | ICD-10-CM | POA: Diagnosis not present

## 2013-08-23 DIAGNOSIS — E538 Deficiency of other specified B group vitamins: Secondary | ICD-10-CM | POA: Diagnosis not present

## 2013-09-27 DIAGNOSIS — Z1231 Encounter for screening mammogram for malignant neoplasm of breast: Secondary | ICD-10-CM | POA: Diagnosis not present

## 2013-11-01 DIAGNOSIS — S93519A Sprain of interphalangeal joint of unspecified toe(s), initial encounter: Secondary | ICD-10-CM | POA: Diagnosis not present

## 2013-11-24 DIAGNOSIS — E780 Pure hypercholesterolemia: Secondary | ICD-10-CM | POA: Diagnosis not present

## 2013-11-24 DIAGNOSIS — Z8249 Family history of ischemic heart disease and other diseases of the circulatory system: Secondary | ICD-10-CM | POA: Diagnosis not present

## 2013-11-24 DIAGNOSIS — Z23 Encounter for immunization: Secondary | ICD-10-CM | POA: Diagnosis not present

## 2013-11-24 DIAGNOSIS — D869 Sarcoidosis, unspecified: Secondary | ICD-10-CM | POA: Diagnosis not present

## 2013-11-24 DIAGNOSIS — M159 Polyosteoarthritis, unspecified: Secondary | ICD-10-CM | POA: Diagnosis not present

## 2013-12-19 ENCOUNTER — Encounter: Payer: Self-pay | Admitting: Obstetrics and Gynecology

## 2014-01-02 DIAGNOSIS — M5412 Radiculopathy, cervical region: Secondary | ICD-10-CM | POA: Diagnosis not present

## 2014-03-07 DIAGNOSIS — G5760 Lesion of plantar nerve, unspecified lower limb: Secondary | ICD-10-CM | POA: Diagnosis not present

## 2014-03-14 DIAGNOSIS — E559 Vitamin D deficiency, unspecified: Secondary | ICD-10-CM | POA: Diagnosis not present

## 2014-03-14 DIAGNOSIS — Z79899 Other long term (current) drug therapy: Secondary | ICD-10-CM | POA: Diagnosis not present

## 2014-03-14 DIAGNOSIS — M1990 Unspecified osteoarthritis, unspecified site: Secondary | ICD-10-CM | POA: Diagnosis not present

## 2014-03-14 DIAGNOSIS — K210 Gastro-esophageal reflux disease with esophagitis: Secondary | ICD-10-CM | POA: Diagnosis not present

## 2014-03-14 DIAGNOSIS — L259 Unspecified contact dermatitis, unspecified cause: Secondary | ICD-10-CM | POA: Diagnosis not present

## 2014-03-14 DIAGNOSIS — Z8249 Family history of ischemic heart disease and other diseases of the circulatory system: Secondary | ICD-10-CM | POA: Diagnosis not present

## 2014-03-14 DIAGNOSIS — D869 Sarcoidosis, unspecified: Secondary | ICD-10-CM | POA: Diagnosis not present

## 2014-03-14 DIAGNOSIS — E780 Pure hypercholesterolemia: Secondary | ICD-10-CM | POA: Diagnosis not present

## 2014-05-01 DIAGNOSIS — H6123 Impacted cerumen, bilateral: Secondary | ICD-10-CM | POA: Diagnosis not present

## 2014-05-01 DIAGNOSIS — H938X1 Other specified disorders of right ear: Secondary | ICD-10-CM | POA: Diagnosis not present

## 2014-05-15 DIAGNOSIS — S8390XA Sprain of unspecified site of unspecified knee, initial encounter: Secondary | ICD-10-CM | POA: Diagnosis not present

## 2014-07-04 DIAGNOSIS — M159 Polyosteoarthritis, unspecified: Secondary | ICD-10-CM | POA: Diagnosis not present

## 2014-07-04 DIAGNOSIS — Z8249 Family history of ischemic heart disease and other diseases of the circulatory system: Secondary | ICD-10-CM | POA: Diagnosis not present

## 2014-07-04 DIAGNOSIS — L259 Unspecified contact dermatitis, unspecified cause: Secondary | ICD-10-CM | POA: Diagnosis not present

## 2014-07-04 DIAGNOSIS — M6240 Contracture of muscle, unspecified site: Secondary | ICD-10-CM | POA: Diagnosis not present

## 2014-07-04 DIAGNOSIS — E780 Pure hypercholesterolemia: Secondary | ICD-10-CM | POA: Diagnosis not present

## 2014-09-29 DIAGNOSIS — Z1231 Encounter for screening mammogram for malignant neoplasm of breast: Secondary | ICD-10-CM | POA: Diagnosis not present

## 2014-10-31 DIAGNOSIS — Z79899 Other long term (current) drug therapy: Secondary | ICD-10-CM | POA: Diagnosis not present

## 2014-10-31 DIAGNOSIS — Z23 Encounter for immunization: Secondary | ICD-10-CM | POA: Diagnosis not present

## 2014-10-31 DIAGNOSIS — Z8249 Family history of ischemic heart disease and other diseases of the circulatory system: Secondary | ICD-10-CM | POA: Diagnosis not present

## 2014-10-31 DIAGNOSIS — E780 Pure hypercholesterolemia: Secondary | ICD-10-CM | POA: Diagnosis not present

## 2014-10-31 DIAGNOSIS — Z0001 Encounter for general adult medical examination with abnormal findings: Secondary | ICD-10-CM | POA: Diagnosis not present

## 2014-10-31 DIAGNOSIS — L259 Unspecified contact dermatitis, unspecified cause: Secondary | ICD-10-CM | POA: Diagnosis not present

## 2014-11-01 DIAGNOSIS — M7121 Synovial cyst of popliteal space [Baker], right knee: Secondary | ICD-10-CM | POA: Diagnosis not present

## 2014-12-11 DIAGNOSIS — M7121 Synovial cyst of popliteal space [Baker], right knee: Secondary | ICD-10-CM | POA: Diagnosis not present

## 2015-02-07 DIAGNOSIS — Z01411 Encounter for gynecological examination (general) (routine) with abnormal findings: Secondary | ICD-10-CM | POA: Diagnosis not present

## 2015-02-07 DIAGNOSIS — N952 Postmenopausal atrophic vaginitis: Secondary | ICD-10-CM | POA: Diagnosis not present

## 2015-02-07 DIAGNOSIS — Z6826 Body mass index (BMI) 26.0-26.9, adult: Secondary | ICD-10-CM | POA: Diagnosis not present

## 2015-02-27 DIAGNOSIS — Z8249 Family history of ischemic heart disease and other diseases of the circulatory system: Secondary | ICD-10-CM | POA: Diagnosis not present

## 2015-02-27 DIAGNOSIS — L259 Unspecified contact dermatitis, unspecified cause: Secondary | ICD-10-CM | POA: Diagnosis not present

## 2015-02-27 DIAGNOSIS — M1990 Unspecified osteoarthritis, unspecified site: Secondary | ICD-10-CM | POA: Diagnosis not present

## 2015-02-27 DIAGNOSIS — M6240 Contracture of muscle, unspecified site: Secondary | ICD-10-CM | POA: Diagnosis not present

## 2015-02-27 DIAGNOSIS — E7800 Pure hypercholesterolemia, unspecified: Secondary | ICD-10-CM | POA: Diagnosis not present

## 2015-05-28 DIAGNOSIS — Z79899 Other long term (current) drug therapy: Secondary | ICD-10-CM | POA: Diagnosis not present

## 2015-05-28 DIAGNOSIS — M2550 Pain in unspecified joint: Secondary | ICD-10-CM | POA: Diagnosis not present

## 2015-05-28 DIAGNOSIS — E7800 Pure hypercholesterolemia, unspecified: Secondary | ICD-10-CM | POA: Diagnosis not present

## 2015-05-28 DIAGNOSIS — D869 Sarcoidosis, unspecified: Secondary | ICD-10-CM | POA: Diagnosis not present

## 2015-05-28 DIAGNOSIS — M159 Polyosteoarthritis, unspecified: Secondary | ICD-10-CM | POA: Diagnosis not present

## 2015-05-28 DIAGNOSIS — M6240 Contracture of muscle, unspecified site: Secondary | ICD-10-CM | POA: Diagnosis not present

## 2015-05-28 DIAGNOSIS — Z8249 Family history of ischemic heart disease and other diseases of the circulatory system: Secondary | ICD-10-CM | POA: Diagnosis not present

## 2015-05-28 DIAGNOSIS — L259 Unspecified contact dermatitis, unspecified cause: Secondary | ICD-10-CM | POA: Diagnosis not present

## 2015-05-28 DIAGNOSIS — E559 Vitamin D deficiency, unspecified: Secondary | ICD-10-CM | POA: Diagnosis not present

## 2015-10-01 DIAGNOSIS — Z1231 Encounter for screening mammogram for malignant neoplasm of breast: Secondary | ICD-10-CM | POA: Diagnosis not present

## 2015-10-11 DIAGNOSIS — L259 Unspecified contact dermatitis, unspecified cause: Secondary | ICD-10-CM | POA: Diagnosis not present

## 2015-10-11 DIAGNOSIS — J309 Allergic rhinitis, unspecified: Secondary | ICD-10-CM | POA: Diagnosis not present

## 2015-10-11 DIAGNOSIS — R252 Cramp and spasm: Secondary | ICD-10-CM | POA: Diagnosis not present

## 2015-10-11 DIAGNOSIS — Z8249 Family history of ischemic heart disease and other diseases of the circulatory system: Secondary | ICD-10-CM | POA: Diagnosis not present

## 2015-10-11 DIAGNOSIS — M1990 Unspecified osteoarthritis, unspecified site: Secondary | ICD-10-CM | POA: Diagnosis not present

## 2015-10-11 DIAGNOSIS — Z79899 Other long term (current) drug therapy: Secondary | ICD-10-CM | POA: Diagnosis not present

## 2015-10-11 DIAGNOSIS — E7800 Pure hypercholesterolemia, unspecified: Secondary | ICD-10-CM | POA: Diagnosis not present

## 2015-10-11 DIAGNOSIS — K210 Gastro-esophageal reflux disease with esophagitis: Secondary | ICD-10-CM | POA: Diagnosis not present

## 2015-10-11 DIAGNOSIS — D869 Sarcoidosis, unspecified: Secondary | ICD-10-CM | POA: Diagnosis not present

## 2015-10-11 DIAGNOSIS — E559 Vitamin D deficiency, unspecified: Secondary | ICD-10-CM | POA: Diagnosis not present

## 2015-11-26 DIAGNOSIS — D869 Sarcoidosis, unspecified: Secondary | ICD-10-CM | POA: Diagnosis not present

## 2015-11-26 DIAGNOSIS — Z23 Encounter for immunization: Secondary | ICD-10-CM | POA: Diagnosis not present

## 2015-11-26 DIAGNOSIS — M159 Polyosteoarthritis, unspecified: Secondary | ICD-10-CM | POA: Diagnosis not present

## 2015-11-26 DIAGNOSIS — E559 Vitamin D deficiency, unspecified: Secondary | ICD-10-CM | POA: Diagnosis not present

## 2015-11-26 DIAGNOSIS — M2550 Pain in unspecified joint: Secondary | ICD-10-CM | POA: Diagnosis not present

## 2015-11-26 DIAGNOSIS — K210 Gastro-esophageal reflux disease with esophagitis: Secondary | ICD-10-CM | POA: Diagnosis not present

## 2015-11-26 DIAGNOSIS — E7800 Pure hypercholesterolemia, unspecified: Secondary | ICD-10-CM | POA: Diagnosis not present

## 2015-11-26 DIAGNOSIS — L259 Unspecified contact dermatitis, unspecified cause: Secondary | ICD-10-CM | POA: Diagnosis not present

## 2015-11-26 DIAGNOSIS — Z8249 Family history of ischemic heart disease and other diseases of the circulatory system: Secondary | ICD-10-CM | POA: Diagnosis not present

## 2015-11-26 DIAGNOSIS — Z79899 Other long term (current) drug therapy: Secondary | ICD-10-CM | POA: Diagnosis not present

## 2015-11-26 DIAGNOSIS — R252 Cramp and spasm: Secondary | ICD-10-CM | POA: Diagnosis not present

## 2016-02-19 DIAGNOSIS — Z1272 Encounter for screening for malignant neoplasm of vagina: Secondary | ICD-10-CM | POA: Diagnosis not present

## 2016-02-19 DIAGNOSIS — Z6828 Body mass index (BMI) 28.0-28.9, adult: Secondary | ICD-10-CM | POA: Diagnosis not present

## 2016-02-19 DIAGNOSIS — Z01411 Encounter for gynecological examination (general) (routine) with abnormal findings: Secondary | ICD-10-CM | POA: Diagnosis not present

## 2016-02-19 DIAGNOSIS — N952 Postmenopausal atrophic vaginitis: Secondary | ICD-10-CM | POA: Diagnosis not present

## 2016-03-25 DIAGNOSIS — E559 Vitamin D deficiency, unspecified: Secondary | ICD-10-CM | POA: Diagnosis not present

## 2016-03-25 DIAGNOSIS — D869 Sarcoidosis, unspecified: Secondary | ICD-10-CM | POA: Diagnosis not present

## 2016-03-25 DIAGNOSIS — R252 Cramp and spasm: Secondary | ICD-10-CM | POA: Diagnosis not present

## 2016-03-25 DIAGNOSIS — M25519 Pain in unspecified shoulder: Secondary | ICD-10-CM | POA: Diagnosis not present

## 2016-03-25 DIAGNOSIS — M2550 Pain in unspecified joint: Secondary | ICD-10-CM | POA: Diagnosis not present

## 2016-03-25 DIAGNOSIS — L259 Unspecified contact dermatitis, unspecified cause: Secondary | ICD-10-CM | POA: Diagnosis not present

## 2016-03-25 DIAGNOSIS — E7800 Pure hypercholesterolemia, unspecified: Secondary | ICD-10-CM | POA: Diagnosis not present

## 2016-03-25 DIAGNOSIS — Z8249 Family history of ischemic heart disease and other diseases of the circulatory system: Secondary | ICD-10-CM | POA: Diagnosis not present

## 2016-03-25 DIAGNOSIS — M159 Polyosteoarthritis, unspecified: Secondary | ICD-10-CM | POA: Diagnosis not present

## 2016-03-25 DIAGNOSIS — K210 Gastro-esophageal reflux disease with esophagitis: Secondary | ICD-10-CM | POA: Diagnosis not present

## 2016-03-25 DIAGNOSIS — Z79899 Other long term (current) drug therapy: Secondary | ICD-10-CM | POA: Diagnosis not present

## 2016-04-01 ENCOUNTER — Ambulatory Visit (HOSPITAL_COMMUNITY)
Admission: RE | Admit: 2016-04-01 | Discharge: 2016-04-01 | Disposition: A | Discharge status: Home / Self Care | Payer: Medicare Other | Source: Ambulatory Visit | Attending: Pulmonary Disease | Admitting: Pulmonary Disease

## 2016-04-01 ENCOUNTER — Other Ambulatory Visit (HOSPITAL_COMMUNITY): Payer: Self-pay | Admitting: Pulmonary Disease

## 2016-04-01 DIAGNOSIS — M25512 Pain in left shoulder: Secondary | ICD-10-CM

## 2016-04-29 ENCOUNTER — Ambulatory Visit (INDEPENDENT_AMBULATORY_CARE_PROVIDER_SITE_OTHER): Payer: Medicare Other | Admitting: Orthopaedic Surgery

## 2016-04-29 ENCOUNTER — Ambulatory Visit (INDEPENDENT_AMBULATORY_CARE_PROVIDER_SITE_OTHER): Payer: Medicare Other

## 2016-04-29 ENCOUNTER — Encounter (INDEPENDENT_AMBULATORY_CARE_PROVIDER_SITE_OTHER): Payer: Self-pay | Admitting: Orthopaedic Surgery

## 2016-04-29 VITALS — BP 140/82 | HR 78 | Ht 66.5 in | Wt 168.0 lb

## 2016-04-29 DIAGNOSIS — M542 Cervicalgia: Secondary | ICD-10-CM | POA: Diagnosis not present

## 2016-04-29 DIAGNOSIS — M7542 Impingement syndrome of left shoulder: Secondary | ICD-10-CM | POA: Diagnosis not present

## 2016-04-29 MED ORDER — BUPIVACAINE HCL 0.25 % IJ SOLN
4.0000 mL | INTRAMUSCULAR | Status: AC | PRN
  Administered 2016-04-29: 4 mL via INTRA_ARTICULAR

## 2016-04-29 MED ORDER — METHYLPREDNISOLONE ACETATE 40 MG/ML IJ SUSP
40.0000 mg | INTRAMUSCULAR | Status: AC | PRN
  Administered 2016-04-29: 40 mg via INTRA_ARTICULAR

## 2016-04-29 MED ORDER — LIDOCAINE HCL 1 % IJ SOLN
1.0000 mL | INTRAMUSCULAR | Status: AC | PRN
  Administered 2016-04-29: 1 mL

## 2016-04-29 NOTE — Progress Notes (Signed)
Office Visit Note   Patient: Tina Clements           Date of Birth: Oct 18, 1940           MRN: [id number] Visit Date: 04/29/2016              Requested by: [name], MD [address] [place], [place] [address] PCP: Leola Brazil, MD   Assessment & Plan: Visit Diagnoses:  1. Neck pain   2. Impingement syndrome of left shoulder     Plan: Left shoulder subacromial injection performed with good relief. She has system problems she will call we can proceed with an MRI scan. She started had rotator cuff tear the opposite right side and is having similar symptoms to the left shoulder with pain with stretch reaching and overhead activities. If she gets good relief with the injection she can return when necessary.  Follow-Up Instructions: Return if symptoms worsen or fail to improve.   Orders:  Orders Placed This Encounter  Procedures  . XR Cervical Spine 2 or 3 views   No orders of the defined types were placed in this encounter.     Procedures: Large Joint Inj Date/Time: 04/29/2016 4:47 PM Performed by: Marybelle Killings Authorized by: Marybelle Killings   Consent Given by:  Patient Indications:  Pain Location:  Shoulder Site:  L subacromial bursa Needle Size:  22 G Needle Length:  1.5 inches Ultrasound Guidance: No   Fluoroscopic Guidance: No   Arthrogram: No   Medications:  1 mL lidocaine 1 %; 40 mg methylPREDNISolone acetate 40 MG/ML; 4 mL bupivacaine 0.25 % Aspiration Attempted: No   Patient tolerance:  Patient tolerated the procedure well with no immediate complications     Clinical Data: No additional findings.   Subjective: Chief Complaint  Patient presents with  . Neck - Pain  . Left Shoulder - Pain    Patient was referred by Dr. Marily Memos. She is having left shoulder and left neck pain. She noticed the onset of pain after the first snow storm in December. It seems to get worse with colder weather. She states that the pain radiates  down the arm to just below the elbow and sometimes all of the way to the wrist. She is taking aleve with some relief. She has had left shoulder x-rays made by her PCP. They are on [place].     Review of Systems  Constitutional: Negative for chills and diaphoresis.  HENT: Negative for ear discharge, ear pain and nosebleeds.   Eyes: Negative for discharge and visual disturbance.  Respiratory: Negative for cough, choking and shortness of breath.   Cardiovascular: Negative for chest pain and palpitations.  Gastrointestinal: Negative for abdominal distention and abdominal pain.  Endocrine: Negative for cold intolerance and heat intolerance.       Positive for sarcoidosis, vitamin D deficiency.  Genitourinary: Negative for flank pain and hematuria.  Musculoskeletal:       Previous right shoulder rotator cuff repair by Dr. Marily Memos and also knee arthroscopy in the distant past.  Skin: Negative for rash and wound.  Neurological: Negative for seizures and speech difficulty.  Hematological: Negative for adenopathy. Does not bruise/bleed easily.  Psychiatric/Behavioral: Negative for agitation and suicidal ideas.     Objective: Vital Signs: BP 140/82   Pulse 78   Ht 5' 6.5" (1.689 m)   Wt 168 lb (76.2 kg)   LMP 05/21/1977   BMI 26.71 kg/m   Physical Exam  Constitutional: She is oriented to person, place, and time. She appears well-developed.  HENT:  Head: Normocephalic.  Right Ear: External ear normal.  Left Ear: External ear normal.  Eyes: Pupils are equal, round, and reactive to light.  Neck: No tracheal deviation present. No thyromegaly present.  Cardiovascular: Normal rate.   Pulmonary/Chest: Effort normal.  Abdominal: Soft.  Musculoskeletal:  Healed right shoulder rotator cuff repair incision. She can get her right arm up overhead good cervical range of motion no brachial plexus tenderness negative cervical compression test no change with distraction. Positive impingement  left shoulder negative drop arm test. Long head of the biceps is minimally tender approximately reflexes are 2+ normal heel toe gait.  Neurological: She is alert and oriented to person, place, and time.  Skin: Skin is warm and dry.  Psychiatric: She has a normal mood and affect. Her behavior is normal.    Ortho Exam  Specialty Comments:  No specialty comments available.  Imaging: No results found.   PMFS History: Patient Active Problem List   Diagnosis Date Noted  . History of osteopenia - dx'd 2010 01/21/2012  . History of vitamin D deficiency 01/21/2012  . Hyperlipemia   . Hyperlipidemia   . Sarcoidosis [facility])    Past Medical History:  Diagnosis Date  . Arthritis   . Hyperlipidemia   . Sarcoidosis ([place])     Family History  Problem Relation Age of Onset  . Heart disease Mother   . Cancer Father   . Hepatitis C Sister   . Sarcoidosis Sister     Past Surgical History:  Procedure Laterality Date  . ABDOMINAL HYSTERECTOMY    . KNEE ARTHROSCOPY    . RADICAL HYSTERECTOMY WITH TRANSPOSITION OF OVARIES    . SHOULDER ARTHROSCOPY    . TONSILLECTOMY     Social History   Occupational History  . Not on file.   Social History Main Topics  . Smoking status: Never Smoker  . Smokeless tobacco: Never Used  . Alcohol use Yes     Comment: occasional wine   . Drug use: No  . Sexual activity: Yes    Birth control/ protection: None

## 2016-06-24 DIAGNOSIS — M2550 Pain in unspecified joint: Secondary | ICD-10-CM | POA: Diagnosis not present

## 2016-06-24 DIAGNOSIS — Z79899 Other long term (current) drug therapy: Secondary | ICD-10-CM | POA: Diagnosis not present

## 2016-06-24 DIAGNOSIS — E559 Vitamin D deficiency, unspecified: Secondary | ICD-10-CM | POA: Diagnosis not present

## 2016-06-24 DIAGNOSIS — L259 Unspecified contact dermatitis, unspecified cause: Secondary | ICD-10-CM | POA: Diagnosis not present

## 2016-06-24 DIAGNOSIS — R252 Cramp and spasm: Secondary | ICD-10-CM | POA: Diagnosis not present

## 2016-06-24 DIAGNOSIS — E669 Obesity, unspecified: Secondary | ICD-10-CM | POA: Diagnosis not present

## 2016-06-24 DIAGNOSIS — D869 Sarcoidosis, unspecified: Secondary | ICD-10-CM | POA: Diagnosis not present

## 2016-06-24 DIAGNOSIS — E7800 Pure hypercholesterolemia, unspecified: Secondary | ICD-10-CM | POA: Diagnosis not present

## 2016-07-28 DIAGNOSIS — K640 First degree hemorrhoids: Secondary | ICD-10-CM | POA: Diagnosis not present

## 2016-07-28 DIAGNOSIS — D126 Benign neoplasm of colon, unspecified: Secondary | ICD-10-CM | POA: Diagnosis not present

## 2016-07-28 DIAGNOSIS — K5730 Diverticulosis of large intestine without perforation or abscess without bleeding: Secondary | ICD-10-CM | POA: Diagnosis not present

## 2016-07-28 DIAGNOSIS — Z86010 Personal history of colonic polyps: Secondary | ICD-10-CM | POA: Diagnosis not present

## 2016-07-31 DIAGNOSIS — D126 Benign neoplasm of colon, unspecified: Secondary | ICD-10-CM | POA: Diagnosis not present

## 2016-10-01 DIAGNOSIS — Z1231 Encounter for screening mammogram for malignant neoplasm of breast: Secondary | ICD-10-CM | POA: Diagnosis not present

## 2016-11-04 DIAGNOSIS — Z79899 Other long term (current) drug therapy: Secondary | ICD-10-CM | POA: Diagnosis not present

## 2016-11-04 DIAGNOSIS — R252 Cramp and spasm: Secondary | ICD-10-CM | POA: Diagnosis not present

## 2016-11-04 DIAGNOSIS — L259 Unspecified contact dermatitis, unspecified cause: Secondary | ICD-10-CM | POA: Diagnosis not present

## 2016-11-04 DIAGNOSIS — M2550 Pain in unspecified joint: Secondary | ICD-10-CM | POA: Diagnosis not present

## 2016-11-04 DIAGNOSIS — Z8249 Family history of ischemic heart disease and other diseases of the circulatory system: Secondary | ICD-10-CM | POA: Diagnosis not present

## 2016-11-04 DIAGNOSIS — K210 Gastro-esophageal reflux disease with esophagitis: Secondary | ICD-10-CM | POA: Diagnosis not present

## 2016-11-04 DIAGNOSIS — D869 Sarcoidosis, unspecified: Secondary | ICD-10-CM | POA: Diagnosis not present

## 2016-11-04 DIAGNOSIS — E559 Vitamin D deficiency, unspecified: Secondary | ICD-10-CM | POA: Diagnosis not present

## 2016-11-04 DIAGNOSIS — E7800 Pure hypercholesterolemia, unspecified: Secondary | ICD-10-CM | POA: Diagnosis not present

## 2016-11-04 DIAGNOSIS — Z23 Encounter for immunization: Secondary | ICD-10-CM | POA: Diagnosis not present

## 2016-11-04 DIAGNOSIS — M159 Polyosteoarthritis, unspecified: Secondary | ICD-10-CM | POA: Diagnosis not present

## 2017-02-24 DIAGNOSIS — Z01411 Encounter for gynecological examination (general) (routine) with abnormal findings: Secondary | ICD-10-CM | POA: Diagnosis not present

## 2017-02-24 DIAGNOSIS — Z6828 Body mass index (BMI) 28.0-28.9, adult: Secondary | ICD-10-CM | POA: Diagnosis not present

## 2017-03-03 DIAGNOSIS — M159 Polyosteoarthritis, unspecified: Secondary | ICD-10-CM | POA: Diagnosis not present

## 2017-03-03 DIAGNOSIS — E559 Vitamin D deficiency, unspecified: Secondary | ICD-10-CM | POA: Diagnosis not present

## 2017-03-03 DIAGNOSIS — K210 Gastro-esophageal reflux disease with esophagitis: Secondary | ICD-10-CM | POA: Diagnosis not present

## 2017-03-03 DIAGNOSIS — E7800 Pure hypercholesterolemia, unspecified: Secondary | ICD-10-CM | POA: Diagnosis not present

## 2017-03-03 DIAGNOSIS — D869 Sarcoidosis, unspecified: Secondary | ICD-10-CM | POA: Diagnosis not present

## 2017-03-03 DIAGNOSIS — Z79899 Other long term (current) drug therapy: Secondary | ICD-10-CM | POA: Diagnosis not present

## 2017-03-03 DIAGNOSIS — M2550 Pain in unspecified joint: Secondary | ICD-10-CM | POA: Diagnosis not present

## 2017-03-03 DIAGNOSIS — R252 Cramp and spasm: Secondary | ICD-10-CM | POA: Diagnosis not present

## 2017-03-03 DIAGNOSIS — Z8249 Family history of ischemic heart disease and other diseases of the circulatory system: Secondary | ICD-10-CM | POA: Diagnosis not present

## 2017-03-03 DIAGNOSIS — M25512 Pain in left shoulder: Secondary | ICD-10-CM | POA: Diagnosis not present

## 2017-04-07 DIAGNOSIS — M65871 Other synovitis and tenosynovitis, right ankle and foot: Secondary | ICD-10-CM | POA: Diagnosis not present

## 2017-04-07 DIAGNOSIS — M722 Plantar fascial fibromatosis: Secondary | ICD-10-CM | POA: Diagnosis not present

## 2017-04-14 DIAGNOSIS — M722 Plantar fascial fibromatosis: Secondary | ICD-10-CM | POA: Diagnosis not present

## 2017-04-17 DIAGNOSIS — Z0000 Encounter for general adult medical examination without abnormal findings: Secondary | ICD-10-CM | POA: Diagnosis not present

## 2017-04-28 DIAGNOSIS — M722 Plantar fascial fibromatosis: Secondary | ICD-10-CM | POA: Diagnosis not present

## 2017-06-29 DIAGNOSIS — Z79899 Other long term (current) drug therapy: Secondary | ICD-10-CM | POA: Diagnosis not present

## 2017-06-29 DIAGNOSIS — M2550 Pain in unspecified joint: Secondary | ICD-10-CM | POA: Diagnosis not present

## 2017-06-29 DIAGNOSIS — R252 Cramp and spasm: Secondary | ICD-10-CM | POA: Diagnosis not present

## 2017-06-29 DIAGNOSIS — E7800 Pure hypercholesterolemia, unspecified: Secondary | ICD-10-CM | POA: Diagnosis not present

## 2017-06-29 DIAGNOSIS — I10 Essential (primary) hypertension: Secondary | ICD-10-CM | POA: Diagnosis not present

## 2017-06-29 DIAGNOSIS — Z8249 Family history of ischemic heart disease and other diseases of the circulatory system: Secondary | ICD-10-CM | POA: Diagnosis not present

## 2017-06-29 DIAGNOSIS — M25519 Pain in unspecified shoulder: Secondary | ICD-10-CM | POA: Diagnosis not present

## 2017-06-29 DIAGNOSIS — G933 Postviral fatigue syndrome: Secondary | ICD-10-CM | POA: Diagnosis not present

## 2017-06-29 DIAGNOSIS — M159 Polyosteoarthritis, unspecified: Secondary | ICD-10-CM | POA: Diagnosis not present

## 2017-06-29 DIAGNOSIS — E559 Vitamin D deficiency, unspecified: Secondary | ICD-10-CM | POA: Diagnosis not present

## 2017-06-29 DIAGNOSIS — D869 Sarcoidosis, unspecified: Secondary | ICD-10-CM | POA: Diagnosis not present

## 2017-06-29 DIAGNOSIS — M722 Plantar fascial fibromatosis: Secondary | ICD-10-CM | POA: Diagnosis not present

## 2017-10-16 DIAGNOSIS — Z1231 Encounter for screening mammogram for malignant neoplasm of breast: Secondary | ICD-10-CM | POA: Diagnosis not present

## 2017-10-23 DIAGNOSIS — N6002 Solitary cyst of left breast: Secondary | ICD-10-CM | POA: Diagnosis not present

## 2017-10-23 DIAGNOSIS — R921 Mammographic calcification found on diagnostic imaging of breast: Secondary | ICD-10-CM | POA: Diagnosis not present

## 2017-10-23 DIAGNOSIS — R922 Inconclusive mammogram: Secondary | ICD-10-CM | POA: Diagnosis not present

## 2017-12-31 DIAGNOSIS — M159 Polyosteoarthritis, unspecified: Secondary | ICD-10-CM | POA: Diagnosis not present

## 2017-12-31 DIAGNOSIS — R252 Cramp and spasm: Secondary | ICD-10-CM | POA: Diagnosis not present

## 2017-12-31 DIAGNOSIS — E7800 Pure hypercholesterolemia, unspecified: Secondary | ICD-10-CM | POA: Diagnosis not present

## 2017-12-31 DIAGNOSIS — M2550 Pain in unspecified joint: Secondary | ICD-10-CM | POA: Diagnosis not present

## 2017-12-31 DIAGNOSIS — D869 Sarcoidosis, unspecified: Secondary | ICD-10-CM | POA: Diagnosis not present

## 2017-12-31 DIAGNOSIS — M25511 Pain in right shoulder: Secondary | ICD-10-CM | POA: Diagnosis not present

## 2017-12-31 DIAGNOSIS — E669 Obesity, unspecified: Secondary | ICD-10-CM | POA: Diagnosis not present

## 2017-12-31 DIAGNOSIS — Z79899 Other long term (current) drug therapy: Secondary | ICD-10-CM | POA: Diagnosis not present

## 2017-12-31 DIAGNOSIS — E559 Vitamin D deficiency, unspecified: Secondary | ICD-10-CM | POA: Diagnosis not present

## 2017-12-31 DIAGNOSIS — K210 Gastro-esophageal reflux disease with esophagitis: Secondary | ICD-10-CM | POA: Diagnosis not present

## 2017-12-31 DIAGNOSIS — M722 Plantar fascial fibromatosis: Secondary | ICD-10-CM | POA: Diagnosis not present

## 2018-01-04 DIAGNOSIS — Z23 Encounter for immunization: Secondary | ICD-10-CM | POA: Diagnosis not present

## 2018-01-07 DIAGNOSIS — M25561 Pain in right knee: Secondary | ICD-10-CM | POA: Diagnosis not present

## 2018-01-07 DIAGNOSIS — M1711 Unilateral primary osteoarthritis, right knee: Secondary | ICD-10-CM | POA: Diagnosis not present

## 2018-02-24 DIAGNOSIS — Z01411 Encounter for gynecological examination (general) (routine) with abnormal findings: Secondary | ICD-10-CM | POA: Diagnosis not present

## 2018-02-24 DIAGNOSIS — Z6828 Body mass index (BMI) 28.0-28.9, adult: Secondary | ICD-10-CM | POA: Diagnosis not present

## 2018-04-22 DIAGNOSIS — M25561 Pain in right knee: Secondary | ICD-10-CM | POA: Diagnosis not present

## 2018-04-22 DIAGNOSIS — M25511 Pain in right shoulder: Secondary | ICD-10-CM | POA: Diagnosis not present

## 2018-05-24 DIAGNOSIS — I119 Hypertensive heart disease without heart failure: Secondary | ICD-10-CM | POA: Diagnosis not present

## 2018-05-24 DIAGNOSIS — Z8249 Family history of ischemic heart disease and other diseases of the circulatory system: Secondary | ICD-10-CM | POA: Diagnosis not present

## 2018-05-24 DIAGNOSIS — M2550 Pain in unspecified joint: Secondary | ICD-10-CM | POA: Diagnosis not present

## 2018-08-10 DIAGNOSIS — I119 Hypertensive heart disease without heart failure: Secondary | ICD-10-CM | POA: Diagnosis not present

## 2018-08-10 DIAGNOSIS — K210 Gastro-esophageal reflux disease with esophagitis: Secondary | ICD-10-CM | POA: Diagnosis not present

## 2018-08-10 DIAGNOSIS — M1990 Unspecified osteoarthritis, unspecified site: Secondary | ICD-10-CM | POA: Diagnosis not present

## 2018-08-10 DIAGNOSIS — Z8249 Family history of ischemic heart disease and other diseases of the circulatory system: Secondary | ICD-10-CM | POA: Diagnosis not present

## 2018-08-10 DIAGNOSIS — Z139 Encounter for screening, unspecified: Secondary | ICD-10-CM | POA: Diagnosis not present

## 2018-08-10 DIAGNOSIS — R252 Cramp and spasm: Secondary | ICD-10-CM | POA: Diagnosis not present

## 2018-08-10 DIAGNOSIS — I10 Essential (primary) hypertension: Secondary | ICD-10-CM | POA: Diagnosis not present

## 2018-08-10 DIAGNOSIS — Z79899 Other long term (current) drug therapy: Secondary | ICD-10-CM | POA: Diagnosis not present

## 2018-08-10 DIAGNOSIS — D869 Sarcoidosis, unspecified: Secondary | ICD-10-CM | POA: Diagnosis not present

## 2018-08-10 DIAGNOSIS — E559 Vitamin D deficiency, unspecified: Secondary | ICD-10-CM | POA: Diagnosis not present

## 2018-08-10 DIAGNOSIS — E7800 Pure hypercholesterolemia, unspecified: Secondary | ICD-10-CM | POA: Diagnosis not present

## 2018-08-10 DIAGNOSIS — M25473 Effusion, unspecified ankle: Secondary | ICD-10-CM | POA: Diagnosis not present

## 2018-08-10 DIAGNOSIS — L298 Other pruritus: Secondary | ICD-10-CM | POA: Diagnosis not present

## 2018-08-10 DIAGNOSIS — M2550 Pain in unspecified joint: Secondary | ICD-10-CM | POA: Diagnosis not present

## 2018-08-19 DIAGNOSIS — Z20828 Contact with and (suspected) exposure to other viral communicable diseases: Secondary | ICD-10-CM | POA: Diagnosis not present

## 2018-09-10 DIAGNOSIS — M1711 Unilateral primary osteoarthritis, right knee: Secondary | ICD-10-CM | POA: Diagnosis not present

## 2018-10-29 DIAGNOSIS — Z1231 Encounter for screening mammogram for malignant neoplasm of breast: Secondary | ICD-10-CM | POA: Diagnosis not present

## 2018-11-02 DIAGNOSIS — M1711 Unilateral primary osteoarthritis, right knee: Secondary | ICD-10-CM | POA: Diagnosis not present

## 2018-11-15 DIAGNOSIS — M1711 Unilateral primary osteoarthritis, right knee: Secondary | ICD-10-CM | POA: Diagnosis not present

## 2018-11-22 DIAGNOSIS — M1711 Unilateral primary osteoarthritis, right knee: Secondary | ICD-10-CM | POA: Diagnosis not present

## 2018-11-29 DIAGNOSIS — M1711 Unilateral primary osteoarthritis, right knee: Secondary | ICD-10-CM | POA: Diagnosis not present

## 2018-12-06 DIAGNOSIS — Z23 Encounter for immunization: Secondary | ICD-10-CM | POA: Diagnosis not present

## 2018-12-07 DIAGNOSIS — M2550 Pain in unspecified joint: Secondary | ICD-10-CM | POA: Diagnosis not present

## 2018-12-07 DIAGNOSIS — E7800 Pure hypercholesterolemia, unspecified: Secondary | ICD-10-CM | POA: Diagnosis not present

## 2018-12-07 DIAGNOSIS — Z8249 Family history of ischemic heart disease and other diseases of the circulatory system: Secondary | ICD-10-CM | POA: Diagnosis not present

## 2018-12-07 DIAGNOSIS — E559 Vitamin D deficiency, unspecified: Secondary | ICD-10-CM | POA: Diagnosis not present

## 2018-12-07 DIAGNOSIS — I119 Hypertensive heart disease without heart failure: Secondary | ICD-10-CM | POA: Diagnosis not present

## 2018-12-07 DIAGNOSIS — M1990 Unspecified osteoarthritis, unspecified site: Secondary | ICD-10-CM | POA: Diagnosis not present

## 2018-12-07 DIAGNOSIS — M25561 Pain in right knee: Secondary | ICD-10-CM | POA: Diagnosis not present

## 2018-12-07 DIAGNOSIS — I10 Essential (primary) hypertension: Secondary | ICD-10-CM | POA: Diagnosis not present

## 2018-12-07 DIAGNOSIS — G933 Postviral fatigue syndrome: Secondary | ICD-10-CM | POA: Diagnosis not present

## 2018-12-07 DIAGNOSIS — L299 Pruritus, unspecified: Secondary | ICD-10-CM | POA: Diagnosis not present

## 2018-12-07 DIAGNOSIS — Z79899 Other long term (current) drug therapy: Secondary | ICD-10-CM | POA: Diagnosis not present

## 2018-12-07 DIAGNOSIS — D869 Sarcoidosis, unspecified: Secondary | ICD-10-CM | POA: Diagnosis not present

## 2018-12-07 DIAGNOSIS — R252 Cramp and spasm: Secondary | ICD-10-CM | POA: Diagnosis not present

## 2019-01-10 DIAGNOSIS — M1711 Unilateral primary osteoarthritis, right knee: Secondary | ICD-10-CM | POA: Diagnosis not present

## 2019-01-31 DIAGNOSIS — M1711 Unilateral primary osteoarthritis, right knee: Secondary | ICD-10-CM | POA: Diagnosis not present

## 2019-01-31 DIAGNOSIS — M25561 Pain in right knee: Secondary | ICD-10-CM | POA: Diagnosis not present

## 2019-03-03 DIAGNOSIS — M25561 Pain in right knee: Secondary | ICD-10-CM | POA: Diagnosis not present

## 2019-03-10 DIAGNOSIS — M1711 Unilateral primary osteoarthritis, right knee: Secondary | ICD-10-CM | POA: Diagnosis not present

## 2019-03-14 DIAGNOSIS — Z124 Encounter for screening for malignant neoplasm of cervix: Secondary | ICD-10-CM | POA: Diagnosis not present

## 2019-03-14 DIAGNOSIS — Z01419 Encounter for gynecological examination (general) (routine) without abnormal findings: Secondary | ICD-10-CM | POA: Diagnosis not present

## 2019-03-14 DIAGNOSIS — N6310 Unspecified lump in the right breast, unspecified quadrant: Secondary | ICD-10-CM | POA: Diagnosis not present

## 2019-03-14 DIAGNOSIS — N762 Acute vulvitis: Secondary | ICD-10-CM | POA: Diagnosis not present

## 2019-03-24 DIAGNOSIS — M1711 Unilateral primary osteoarthritis, right knee: Secondary | ICD-10-CM | POA: Diagnosis not present

## 2019-04-11 DIAGNOSIS — L298 Other pruritus: Secondary | ICD-10-CM | POA: Diagnosis not present

## 2019-04-11 DIAGNOSIS — Z7901 Long term (current) use of anticoagulants: Secondary | ICD-10-CM | POA: Diagnosis not present

## 2019-04-11 DIAGNOSIS — E559 Vitamin D deficiency, unspecified: Secondary | ICD-10-CM | POA: Diagnosis not present

## 2019-04-11 DIAGNOSIS — D869 Sarcoidosis, unspecified: Secondary | ICD-10-CM | POA: Diagnosis not present

## 2019-04-11 DIAGNOSIS — I10 Essential (primary) hypertension: Secondary | ICD-10-CM | POA: Diagnosis not present

## 2019-04-11 DIAGNOSIS — M2550 Pain in unspecified joint: Secondary | ICD-10-CM | POA: Diagnosis not present

## 2019-04-11 DIAGNOSIS — M25561 Pain in right knee: Secondary | ICD-10-CM | POA: Diagnosis not present

## 2019-04-11 DIAGNOSIS — Z8249 Family history of ischemic heart disease and other diseases of the circulatory system: Secondary | ICD-10-CM | POA: Diagnosis not present

## 2019-04-11 DIAGNOSIS — M159 Polyosteoarthritis, unspecified: Secondary | ICD-10-CM | POA: Diagnosis not present

## 2019-04-11 DIAGNOSIS — G933 Postviral fatigue syndrome: Secondary | ICD-10-CM | POA: Diagnosis not present

## 2019-04-11 DIAGNOSIS — E7800 Pure hypercholesterolemia, unspecified: Secondary | ICD-10-CM | POA: Diagnosis not present

## 2019-04-11 DIAGNOSIS — I119 Hypertensive heart disease without heart failure: Secondary | ICD-10-CM | POA: Diagnosis not present

## 2019-04-11 DIAGNOSIS — Z79899 Other long term (current) drug therapy: Secondary | ICD-10-CM | POA: Diagnosis not present

## 2019-04-13 DIAGNOSIS — M8588 Other specified disorders of bone density and structure, other site: Secondary | ICD-10-CM | POA: Diagnosis not present

## 2019-04-18 NOTE — Patient Instructions (Signed)
DUE TO COVID-19 ONLY ONE VISITOR IS ALLOWED TO COME WITH YOU AND STAY IN THE WAITING ROOM ONLY DURING PRE OP AND PROCEDURE DAY OF SURGERY. THE 1 VISITOR MAY VISIT WITH YOU AFTER SURGERY IN YOUR PRIVATE ROOM DURING VISITING HOURS ONLY!  YOU NEED TO HAVE A COVID 19 TEST ON_3/4/21______ @_10 :05______, THIS TEST MUST BE DONE BEFORE SURGERY, COME  [place], [address] , [address].  ([place]) ONCE YOUR COVID TEST IS COMPLETED, PLEASE BEGIN THE QUARANTINE INSTRUCTIONS AS OUTLINED IN YOUR HANDOUT.                Tina Clements    Your procedure is scheduled on: 04/25/19   Report to [facility] Main  Entrance   Report to admitting at  9:05 AM     Call this number if you have problems the morning of surgery [place], NO CHEWING GUM [place].  Do not eat food After Midnight   YOU MAY HAVE CLEAR LIQUIDS FROM MIDNIGHT UNTIL 8:00 AM.   At 8:00 AM Please finish the prescribed Pre-Surgery drink.   Nothing by mouth after you finish the  drink !    Take these medicines the morning of surgery with A SIP OF WATER: none                                 You may not have any metal on your body including hair pins and              piercings  Do not wear jewelry, make-up, lotions, powders or perfumes, deodorant             Do not wear nail polish on your fingernails.  Do not shave  48 hours prior to surgery.             Do not bring valuables to the hospital. [place].  Contacts, dentures or bridgework may not be worn into surgery.       Special Instructions: N/A              Please read over the following fact sheets you were given: _____________________________________________________________________             [facility] - Preparing for Surgery  Before surgery, you can play an important role.  Because skin is not sterile, your  skin needs to be as free of germs as possible.   You can reduce the number of germs on your skin by washing with CHG (chlorahexidine gluconate) soap before surgery.   CHG is an antiseptic cleaner which kills germs and bonds with the skin to continue killing germs even after washing. Please DO NOT use if you have an allergy to CHG or antibacterial soaps .  If your skin becomes reddened/irritated stop using the CHG and inform your nurse when you arrive at Short Stay. Do not shave (including legs and underarms) for at least 48 hours prior to the first CHG shower.   Please follow these instructions carefully:  1.  Shower with CHG Soap the night before surgery and the  morning of Surgery.  2.  If you choose to wash your hair, wash your hair first as usual with your  normal  shampoo.  3.  After you shampoo, rinse your hair and body thoroughly to remove the  shampoo.                                        4.  Use CHG as you would any other liquid soap.  You can apply chg directly  to the skin and wash                       Gently with a scrungie or clean washcloth.  5.  Apply the CHG Soap to your body ONLY FROM THE NECK DOWN.   Do not use on face/ open                           Wound or open sores. Avoid contact with eyes, ears mouth and genitals (private parts).                       Wash face,  Genitals (private parts) with your normal soap.             6.  Wash thoroughly, paying special attention to the area where your surgery  will be performed.  7.  Thoroughly rinse your body with warm water from the neck down.  8.  DO NOT shower/wash with your normal soap after using and rinsing off  the CHG Soap.             9.  Pat yourself dry with a clean towel.            10.  Wear clean pajamas.            11.  Place clean sheets on your bed the night of your first shower and do not  sleep with pets. Day of Surgery : Do not apply any lotions/deodorants the morning of surgery.  Please wear clean clothes to  the hospital/surgery center.  FAILURE TO FOLLOW THESE INSTRUCTIONS MAY RESULT IN THE CANCELLATION OF YOUR SURGERY PATIENT SIGNATURE_________________________________  NURSE SIGNATURE__________________________________  ________________________________________________________________________   [name]  An incentive spirometer is a tool that can help keep your lungs clear and active. This tool measures how well you are filling your lungs with each breath. Taking long deep breaths may help reverse or decrease the chance of developing breathing (pulmonary) problems (especially infection) following:  A long period of time when you are unable to move or be active. BEFORE THE PROCEDURE   If the spirometer includes an indicator to show your best effort, your nurse or respiratory therapist will set it to a desired goal.  If possible, sit up straight or lean slightly forward. Try not to slouch.  Hold the incentive spirometer in an upright position. INSTRUCTIONS FOR USE  1. Sit on the edge of your bed if possible, or sit up as far as you can in bed or on a chair. 2. Hold the incentive spirometer in an upright position. 3. Breathe out normally. 4. Place the mouthpiece in your mouth and seal your lips tightly around it. 5. Breathe in slowly and as deeply as possible, raising the piston or the ball toward the top of the column. 6. Hold your breath for 3-5 seconds or for as long as possible. Allow the piston or ball to fall to the bottom of the  column. 7. Remove the mouthpiece from your mouth and breathe out normally. 8. Rest for a few seconds and repeat Steps 1 through 7 at least 10 times every 1-2 hours when you are awake. Take your time and take a few normal breaths between deep breaths. 9. The spirometer may include an indicator to show your best effort. Use the indicator as a goal to work toward during each repetition. 10. After each set of 10 deep breaths, practice coughing to be  sure your lungs are clear. If you have an incision (the cut made at the time of surgery), support your incision when coughing by placing a pillow or rolled up towels firmly against it. Once you are able to get out of bed, walk around indoors and cough well. You may stop using the incentive spirometer when instructed by your caregiver.  RISKS AND COMPLICATIONS  Take your time so you do not get dizzy or light-headed.  If you are in pain, you may need to take or ask for pain medication before doing incentive spirometry. It is harder to take a deep breath if you are having pain. AFTER USE  Rest and breathe slowly and easily.  It can be helpful to keep track of a log of your progress. Your caregiver can provide you with a simple table to help with this. If you are using the spirometer at home, follow these instructions: [place] IF:   You are having difficultly using the spirometer.  You have trouble using the spirometer as often as instructed.  Your pain medication is not giving enough relief while using the spirometer.  You develop fever of 100.5 F (38.1 C) or higher. SEEK IMMEDIATE MEDICAL CARE IF:   You cough up bloody sputum that had not been present before.  You develop fever of 102 F (38.9 C) or greater.  You develop worsening pain at or near the incision site. MAKE SURE YOU:   Understand these instructions.  Will watch your condition.  Will get help right away if you are not doing well or get worse. Document Released: 06/16/2006 Document Revised: 04/28/2011 Document Reviewed: 08/17/2006 [facility] Patient Information 2014 [place], [place].   ________________________________________________________________________

## 2019-04-18 NOTE — H&P (Signed)
TOTAL KNEE ADMISSION H&P  Patient is being admitted for right total knee arthroplasty.  Subjective:  Chief Complaint: Right knee pain.  HPI: Tina Clements, 79 y.o. female has a history of pain and functional disability in the right knee due to arthritis and has failed non-surgical conservative treatments for greater than 12 weeks to include corticosteriod injections, viscosupplementation injections and activity modification. Onset of symptoms was abrupt, starting 1 years ago with rapidly worsening course since that time. The patient noted prior procedures on the knee to include  arthroscopy on the right knee.  Patient currently rates pain in the right knee at 7 out of 10 with activity. Patient has worsening of pain with activity and weight bearing, crepitus, joint swelling and instability. Patient has evidence of bone-on-bone arthritis of the lateral and patellofemoral compartments. There is significant valgus deformity by imaging studies. There is no active infection.  Patient Active Problem List   Diagnosis Date Noted  . History of osteopenia - dx'd 2010 01/21/2012  . History of vitamin D deficiency 01/21/2012  . Hyperlipemia   . Hyperlipidemia   . Sarcoidosis     Past Medical History:  Diagnosis Date  . Arthritis   . Hyperlipidemia   . Sarcoidosis [facility])     Past Surgical History:  Procedure Laterality Date  . ABDOMINAL HYSTERECTOMY    . KNEE ARTHROSCOPY    . RADICAL HYSTERECTOMY WITH TRANSPOSITION OF OVARIES    . SHOULDER ARTHROSCOPY    . TONSILLECTOMY      Prior to Admission medications   Medication Sig Start Date End Date Taking? Authorizing Provider  acetaminophen (TYLENOL) 325 MG tablet Take 650 mg by mouth every 6 (six) hours as needed for moderate pain.    Yes [provider]  aspirin 81 MG tablet Take 81 mg by mouth daily.   Yes [provider]  ergocalciferol (VITAMIN D2) 1.25 MG (50000 UT) capsule Take 50,000 Units by mouth once a week.   Yes  [provider]  conjugated estrogens (PREMARIN) vaginal cream Place vaginally once a week. Every other week. Patient not taking: Reported on 04/12/2019 01/21/12   Everett Graff, MD    Allergies  Allergen Reactions  . Erythromycin     Headaches    Social History   Socioeconomic History  . Marital status: Married    Spouse name: Not on file  . Number of children: Not on file  . Years of education: Not on file  . Highest education level: Not on file  Occupational History  . Not on file  Tobacco Use  . Smoking status: Never Smoker  . Smokeless tobacco: Never Used  Substance and Sexual Activity  . Alcohol use: Yes    Comment: occasional wine   . Drug use: No  . Sexual activity: Yes    Birth control/protection: None  Other Topics Concern  . Not on file  Social History Narrative  . Not on file   Social Determinants of Health   Financial Resource Strain:   . Difficulty of Paying Living Expenses: Not on file  Food Insecurity:   . Worried About Charity fundraiser in the Last Year: Not on file  . Ran Out of Food in the Last Year: Not on file  Transportation Needs:   . Lack of Transportation (Medical): Not on file  . Lack of Transportation (Non-Medical): Not on file  Physical Activity:   . Days of Exercise per Week: Not on file  . Minutes of Exercise per  Session: Not on file  Stress:   . Feeling of Stress : Not on file  Social Connections:   . Frequency of Communication with Friends and Family: Not on file  . Frequency of Social Gatherings with Friends and Family: Not on file  . Attends Religious Services: Not on file  . Active Member of Clubs or Organizations: Not on file  . Attends Archivist Meetings: Not on file  . Marital Status: Not on file  Intimate Partner Violence:   . Fear of Current or Ex-Partner: Not on file  . Emotionally Abused: Not on file  . Physically Abused: Not on file  . Sexually Abused: Not on file      Tobacco Use:   .  Smoking Tobacco Use: Never Assessed  . Smokeless Tobacco Use: Unknown   Social History   Substance and Sexual Activity  Alcohol Use Yes   Comment: occasional wine     Family History  Problem Relation Age of Onset  . Heart disease Mother   . Cancer Father   . Hepatitis C Sister   . Sarcoidosis Sister     Review of Systems  Constitutional: Negative for chills and fever.  HENT: Negative for congestion, sore throat and tinnitus.   Eyes: Negative for double vision, photophobia and pain.  Respiratory: Negative for cough, shortness of breath and wheezing.   Cardiovascular: Negative for chest pain, palpitations and orthopnea.  Gastrointestinal: Negative for heartburn, nausea and vomiting.  Genitourinary: Negative for dysuria, frequency and urgency.  Musculoskeletal: Positive for joint pain.  Neurological: Negative for dizziness, weakness and headaches.    Objective:  Physical Exam: Well nourished and well developed.  General: Alert and oriented x3, cooperative and pleasant, no acute distress.  Head: normocephalic, atraumatic, neck supple.  Eyes: EOMI.  Respiratory: breath sounds clear in all fields, no wheezing, rales, or rhonchi. Cardiovascular: Regular rate and rhythm, no murmurs, gallops or rubs.  Abdomen: non-tender to palpation and soft, normoactive bowel sounds. Musculoskeletal:  Right Knee Exam:  No effusion present. No swelling present. Significant valgus deformity. The range of motion is: 5 to 110 degrees.  Marked crepitus on range of motion of the knee.  Lateral greater than medial joint line tenderness. The knee is stable.  Calves soft and nontender. Motor function intact in LE. Strength 5/5 LE bilaterally. Neuro: Distal pulses 2+. Sensation to light touch intact in LE.  Vital signs in last 24 hours: Blood pressure: 136/88 mmHg Pulse: 76 bpm  Imaging Review Plain radiographs demonstrate severe degenerative joint disease of the right knee. The overall  alignment is significant valgus. The bone quality appears to be adequate for age and reported activity level.  Assessment/Plan:  End stage arthritis, right knee   The patient history, physical examination, clinical judgment of the provider and imaging studies are consistent with end stage degenerative joint disease of the right knee and total knee arthroplasty is deemed medically necessary. The treatment options including medical management, injection therapy arthroscopy and arthroplasty were discussed at length. The risks and benefits of total knee arthroplasty were presented and reviewed. The risks due to aseptic loosening, infection, stiffness, patella tracking problems, thromboembolic complications and other imponderables were discussed. The patient acknowledged the explanation, agreed to proceed with the plan and consent was signed. Patient is being admitted for inpatient treatment for surgery, pain control, PT, OT, prophylactic antibiotics, VTE prophylaxis, progressive ambulation and ADLs and discharge planning. The patient is planning to be discharged home.  Anticipated LOS equal to or  greater than 2 midnights due to - Age 79 and older with one or more of the following:  - Obesity  - Expected need for hospital services (PT, OT, Nursing) required for safe  discharge  - Anticipated need for postoperative skilled nursing care or inpatient rehab  - Active co-morbidities: None OR   - Unanticipated findings during/Post Surgery: None  - Patient is a high risk of re-admission due to: None  Therapy Plans: Outpatient therapy at [facility] Disposition: Home with husband Planned DVT Prophylaxis: Aspirin 325 mg BID DME Needed: None PCP: [name], MD (has had preoperative evaluation) TXA: IV Allergies: Erythromycin (headache) Anesthesia Concerns: None BMI: 26.8  - Patient was instructed on what medications to stop prior to surgery. - Follow-up visit in 2 weeks with Dr. Wynelle Link -  Begin physical therapy following surgery - Pre-operative lab work as pre-surgical testing - Prescriptions will be provided in hospital at time of discharge  Theresa Duty, PA-C Orthopedic Surgery [facility] [place]

## 2019-04-19 ENCOUNTER — Encounter (HOSPITAL_COMMUNITY)
Admission: RE | Admit: 2019-04-19 | Discharge: 2019-04-19 | Disposition: A | Discharge status: Home / Self Care | Payer: Medicare Other | Source: Ambulatory Visit | Attending: Orthopedic Surgery | Admitting: Orthopedic Surgery

## 2019-04-19 ENCOUNTER — Encounter (HOSPITAL_COMMUNITY): Payer: Self-pay

## 2019-04-19 ENCOUNTER — Other Ambulatory Visit: Payer: Self-pay

## 2019-04-19 DIAGNOSIS — Z01812 Encounter for preprocedural laboratory examination: Secondary | ICD-10-CM | POA: Diagnosis not present

## 2019-04-19 HISTORY — DX: Dyspnea, unspecified: R06.00

## 2019-04-19 LAB — COMPREHENSIVE METABOLIC PANEL
ALT: 18 U/L (ref 0–44)
AST: 21 U/L (ref 15–41)
Albumin: 4.6 g/dL (ref 3.5–5.0)
Alkaline Phosphatase: 82 U/L (ref 38–126)
Anion gap: 10 (ref 5–15)
BUN: 16 mg/dL (ref 8–23)
CO2: 24 mmol/L (ref 22–32)
Calcium: 8.9 mg/dL (ref 8.9–10.3)
Chloride: 108 mmol/L (ref 98–111)
Creatinine, Ser: 0.82 mg/dL (ref 0.44–1.00)
GFR calc Af Amer: >60 mL/min (ref >60)
GFR calc non Af Amer: >60 mL/min (ref >60)
Glucose, Bld: 97 mg/dL (ref 70–99)
Potassium: 3.9 mmol/L (ref 3.5–5.1)
Sodium: 142 mmol/L (ref 135–145)
Total Bilirubin: 0.7 mg/dL (ref 0.3–1.2)
Total Protein: 6.9 g/dL (ref 6.5–8.1)

## 2019-04-19 LAB — CBC
HCT: 37.3 % (ref 36.0–46.0)
Hemoglobin: 11.6 g/dL — ABNORMAL LOW (ref 12.0–15.0)
MCH: 25.2 pg — ABNORMAL LOW (ref 26.0–34.0)
MCHC: 31.1 g/dL (ref 30.0–36.0)
MCV: 81.1 fL (ref 80.0–100.0)
Platelets: 232 10*3/uL (ref 150–400)
RBC: 4.6 MIL/uL (ref 3.87–5.11)
RDW: 14.2 % (ref 11.5–15.5)
WBC: 6.4 10*3/uL (ref 4.0–10.5)
nRBC: 0 % (ref 0.0–0.2)

## 2019-04-19 LAB — PROTIME-INR
INR: 1 (ref 0.8–1.2)
Prothrombin Time: 12.7 seconds (ref 11.4–15.2)

## 2019-04-19 LAB — SURGICAL PCR SCREEN
MRSA, PCR: NEGATIVE
Staphylococcus aureus: NEGATIVE

## 2019-04-19 LAB — APTT: aPTT: 32 seconds (ref 24–36)

## 2019-04-19 NOTE — Progress Notes (Addendum)
PCP - Dr. Darnell Level. kilpatrick Cardiologist - none  Chest x-ray - no EKG - no Stress Test - no ECHO - no Cardiac Cath - no  Sleep Study - no CPAP -   Fasting Blood Sugar - NA Checks Blood Sugar _____ times a day  Blood Thinner Instructions:ASA 20 years ago Pt reports having palpitations and a fast HR while living in [place] Aspirin Instructions:Dr. Aluisio said to stop 7 days before surgery Last Dose:04/18/19  Anesthesia review:   Patient denies shortness of breath, fever, cough and chest pain at PAT appointment Yes. Pt reports that Sarcadosis is well controlled and has not been a problem.  Patient verbalized understanding of instructions that were given to them at the PAT appointment. Patient was also instructed that they will need to review over the PAT instructions again at home before surgery. yes

## 2019-04-20 LAB — ABO/RH: ABO/RH(D): O POS

## 2019-04-21 ENCOUNTER — Inpatient Hospital Stay (HOSPITAL_COMMUNITY): Admission: RE | Admit: 2019-04-21 | Payer: Medicare Other | Source: Ambulatory Visit

## 2019-04-22 ENCOUNTER — Other Ambulatory Visit (HOSPITAL_COMMUNITY)
Admission: RE | Admit: 2019-04-22 | Discharge: 2019-04-22 | Disposition: A | Discharge status: Home / Self Care | Payer: Medicare Other | Source: Ambulatory Visit | Attending: Orthopedic Surgery | Admitting: Orthopedic Surgery

## 2019-04-22 DIAGNOSIS — Z01812 Encounter for preprocedural laboratory examination: Secondary | ICD-10-CM | POA: Diagnosis not present

## 2019-04-22 DIAGNOSIS — Z20822 Contact with and (suspected) exposure to covid-19: Secondary | ICD-10-CM | POA: Insufficient documentation

## 2019-04-22 LAB — SARS CORONAVIRUS 2 (TAT 6-24 HRS): SARS Coronavirus 2: NEGATIVE

## 2019-04-24 MED ORDER — BUPIVACAINE LIPOSOME 1.3 % IJ SUSP
20.0000 mL | Freq: Once | INTRAMUSCULAR | Status: DC
  Filled 2019-04-24: qty 20

## 2019-04-25 ENCOUNTER — Inpatient Hospital Stay (HOSPITAL_COMMUNITY): Payer: Medicare Other | Admitting: Anesthesiology

## 2019-04-25 ENCOUNTER — Observation Stay (HOSPITAL_COMMUNITY)
Admission: RE | Admit: 2019-04-25 | Discharge: 2019-04-26 | Disposition: A | Discharge status: Home / Self Care | Payer: Medicare Other | Attending: Orthopedic Surgery | Admitting: Orthopedic Surgery

## 2019-04-25 ENCOUNTER — Encounter (HOSPITAL_COMMUNITY)
Admission: RE | Disposition: A | Discharge status: Home / Self Care | Payer: Self-pay | Source: Home / Self Care | Attending: Orthopedic Surgery

## 2019-04-25 ENCOUNTER — Encounter (HOSPITAL_COMMUNITY): Payer: Self-pay | Admitting: Orthopedic Surgery

## 2019-04-25 ENCOUNTER — Inpatient Hospital Stay (HOSPITAL_COMMUNITY): Payer: Medicare Other | Admitting: Physician Assistant

## 2019-04-25 ENCOUNTER — Other Ambulatory Visit: Payer: Self-pay

## 2019-04-25 DIAGNOSIS — Z6821 Body mass index (BMI) 21.0-21.9, adult: Secondary | ICD-10-CM | POA: Insufficient documentation

## 2019-04-25 DIAGNOSIS — M25761 Osteophyte, right knee: Secondary | ICD-10-CM | POA: Diagnosis not present

## 2019-04-25 DIAGNOSIS — M1710 Unilateral primary osteoarthritis, unspecified knee: Secondary | ICD-10-CM

## 2019-04-25 DIAGNOSIS — M8580 Other specified disorders of bone density and structure, unspecified site: Secondary | ICD-10-CM | POA: Diagnosis not present

## 2019-04-25 DIAGNOSIS — Z7982 Long term (current) use of aspirin: Secondary | ICD-10-CM | POA: Insufficient documentation

## 2019-04-25 DIAGNOSIS — E669 Obesity, unspecified: Secondary | ICD-10-CM | POA: Insufficient documentation

## 2019-04-25 DIAGNOSIS — Z96651 Presence of right artificial knee joint: Secondary | ICD-10-CM

## 2019-04-25 DIAGNOSIS — Z881 Allergy status to other antibiotic agents status: Secondary | ICD-10-CM | POA: Diagnosis not present

## 2019-04-25 DIAGNOSIS — D869 Sarcoidosis, unspecified: Secondary | ICD-10-CM | POA: Diagnosis not present

## 2019-04-25 DIAGNOSIS — M1711 Unilateral primary osteoarthritis, right knee: Principal | ICD-10-CM | POA: Insufficient documentation

## 2019-04-25 DIAGNOSIS — M179 Osteoarthritis of knee, unspecified: Secondary | ICD-10-CM | POA: Diagnosis present

## 2019-04-25 DIAGNOSIS — G8918 Other acute postprocedural pain: Secondary | ICD-10-CM | POA: Diagnosis not present

## 2019-04-25 DIAGNOSIS — E785 Hyperlipidemia, unspecified: Secondary | ICD-10-CM | POA: Diagnosis not present

## 2019-04-25 HISTORY — PX: TOTAL KNEE ARTHROPLASTY: SHX125

## 2019-04-25 LAB — TYPE AND SCREEN
ABO/RH(D): O POS
Antibody Screen: NEGATIVE

## 2019-04-25 SURGERY — ARTHROPLASTY, KNEE, TOTAL
Anesthesia: Spinal | Site: Knee | Laterality: Right

## 2019-04-25 MED ORDER — MENTHOL 3 MG MT LOZG: 1.0000 | LOZENGE | OROMUCOSAL | Status: DC | PRN

## 2019-04-25 MED ORDER — METHOCARBAMOL 500 MG IVPB - SIMPLE MED
500.0000 mg | Freq: Four times a day (QID) | INTRAVENOUS | Status: DC | PRN
  Filled 2019-04-25: qty 50

## 2019-04-25 MED ORDER — TRAMADOL HCL 50 MG PO TABS: 50.0000 mg | ORAL_TABLET | Freq: Four times a day (QID) | ORAL | Status: DC | PRN

## 2019-04-25 MED ORDER — ONDANSETRON HCL 4 MG/2ML IJ SOLN: 4.0000 mg | Freq: Once | INTRAMUSCULAR | Status: DC | PRN

## 2019-04-25 MED ORDER — PHENOL 1.4 % MT LIQD: 1.0000 | OROMUCOSAL | Status: DC | PRN

## 2019-04-25 MED ORDER — FLEET ENEMA 7-19 GM/118ML RE ENEM: 1.0000 | ENEMA | Freq: Once | RECTAL | Status: DC | PRN

## 2019-04-25 MED ORDER — METOCLOPRAMIDE HCL 5 MG PO TABS: 5.0000 mg | ORAL_TABLET | Freq: Three times a day (TID) | ORAL | Status: DC | PRN

## 2019-04-25 MED ORDER — CHLORHEXIDINE GLUCONATE 4 % EX LIQD: 60.0000 mL | Freq: Once | CUTANEOUS | Status: DC

## 2019-04-25 MED ORDER — TRANEXAMIC ACID-NACL 1000-0.7 MG/100ML-% IV SOLN
1000.0000 mg | Freq: Once | INTRAVENOUS | Status: AC
  Administered 2019-04-25: 1000 mg via INTRAVENOUS
  Filled 2019-04-25: qty 100

## 2019-04-25 MED ORDER — LACTATED RINGERS IV SOLN: INTRAVENOUS | Status: DC

## 2019-04-25 MED ORDER — DIPHENHYDRAMINE HCL 12.5 MG/5ML PO ELIX: 12.5000 mg | ORAL_SOLUTION | ORAL | Status: DC | PRN

## 2019-04-25 MED ORDER — SODIUM CHLORIDE 0.9 % IV SOLN: INTRAVENOUS | Status: DC

## 2019-04-25 MED ORDER — SODIUM CHLORIDE (PF) 0.9 % IJ SOLN
INTRAMUSCULAR | Status: DC | PRN
  Administered 2019-04-25: 60 mL

## 2019-04-25 MED ORDER — ONDANSETRON HCL 4 MG/2ML IJ SOLN
INTRAMUSCULAR | Status: DC | PRN
  Administered 2019-04-25: 4 mg via INTRAVENOUS

## 2019-04-25 MED ORDER — HYDROMORPHONE HCL 1 MG/ML IJ SOLN: 0.5000 mg | INTRAMUSCULAR | Status: DC | PRN

## 2019-04-25 MED ORDER — ONDANSETRON HCL 4 MG/2ML IJ SOLN
INTRAMUSCULAR | Status: AC
  Filled 2019-04-25: qty 2

## 2019-04-25 MED ORDER — POLYETHYLENE GLYCOL 3350 17 G PO PACK: 17.0000 g | PACK | Freq: Every day | ORAL | Status: DC | PRN

## 2019-04-25 MED ORDER — ACETAMINOPHEN 10 MG/ML IV SOLN
1000.0000 mg | Freq: Four times a day (QID) | INTRAVENOUS | Status: DC
  Administered 2019-04-25: 1000 mg via INTRAVENOUS
  Filled 2019-04-25: qty 100

## 2019-04-25 MED ORDER — ONDANSETRON HCL 4 MG/2ML IJ SOLN: 4.0000 mg | Freq: Four times a day (QID) | INTRAMUSCULAR | Status: DC | PRN

## 2019-04-25 MED ORDER — PHENYLEPHRINE HCL (PRESSORS) 10 MG/ML IV SOLN
INTRAVENOUS | Status: AC
  Filled 2019-04-25: qty 1

## 2019-04-25 MED ORDER — BISACODYL 10 MG RE SUPP: 10.0000 mg | Freq: Every day | RECTAL | Status: DC | PRN

## 2019-04-25 MED ORDER — PROPOFOL 500 MG/50ML IV EMUL
INTRAVENOUS | Status: DC | PRN
  Administered 2019-04-25: 75 ug/kg/min via INTRAVENOUS

## 2019-04-25 MED ORDER — SODIUM CHLORIDE 0.9 % IR SOLN
Status: DC | PRN
  Administered 2019-04-25: 1000 mL

## 2019-04-25 MED ORDER — STERILE WATER FOR IRRIGATION IR SOLN
Status: DC | PRN
  Administered 2019-04-25: 2000 mL

## 2019-04-25 MED ORDER — DEXAMETHASONE SODIUM PHOSPHATE 10 MG/ML IJ SOLN
INTRAMUSCULAR | Status: AC
  Filled 2019-04-25: qty 1

## 2019-04-25 MED ORDER — SODIUM CHLORIDE (PF) 0.9 % IJ SOLN
INTRAMUSCULAR | Status: AC
  Filled 2019-04-25: qty 10

## 2019-04-25 MED ORDER — PROPOFOL 10 MG/ML IV BOLUS
INTRAVENOUS | Status: DC | PRN
  Administered 2019-04-25 (×3): 20 mg via INTRAVENOUS

## 2019-04-25 MED ORDER — METHOCARBAMOL 500 MG PO TABS
500.0000 mg | ORAL_TABLET | Freq: Four times a day (QID) | ORAL | Status: DC | PRN
  Administered 2019-04-25 – 2019-04-26 (×2): 500 mg via ORAL
  Filled 2019-04-25 (×2): qty 1

## 2019-04-25 MED ORDER — POVIDONE-IODINE 10 % EX SWAB
2.0000 "application " | Freq: Once | CUTANEOUS | Status: AC
  Administered 2019-04-25: 2 via TOPICAL

## 2019-04-25 MED ORDER — PROPOFOL 10 MG/ML IV BOLUS
INTRAVENOUS | Status: AC
  Filled 2019-04-25: qty 20

## 2019-04-25 MED ORDER — METOCLOPRAMIDE HCL 5 MG/ML IJ SOLN: 5.0000 mg | Freq: Three times a day (TID) | INTRAMUSCULAR | Status: DC | PRN

## 2019-04-25 MED ORDER — TRANEXAMIC ACID-NACL 1000-0.7 MG/100ML-% IV SOLN
1000.0000 mg | INTRAVENOUS | Status: AC
  Administered 2019-04-25: 1000 mg via INTRAVENOUS
  Filled 2019-04-25: qty 100

## 2019-04-25 MED ORDER — ACETAMINOPHEN 325 MG PO TABS
325.0000 mg | ORAL_TABLET | Freq: Four times a day (QID) | ORAL | Status: DC | PRN
  Administered 2019-04-26: 650 mg via ORAL
  Filled 2019-04-25: qty 2

## 2019-04-25 MED ORDER — FENTANYL CITRATE (PF) 100 MCG/2ML IJ SOLN: 25.0000 ug | INTRAMUSCULAR | Status: DC | PRN

## 2019-04-25 MED ORDER — ASPIRIN EC 325 MG PO TBEC
325.0000 mg | DELAYED_RELEASE_TABLET | Freq: Two times a day (BID) | ORAL | Status: DC
  Administered 2019-04-26: 08:00:00 325 mg via ORAL
  Filled 2019-04-25: qty 1

## 2019-04-25 MED ORDER — CEFAZOLIN SODIUM-DEXTROSE 2-4 GM/100ML-% IV SOLN
2.0000 g | INTRAVENOUS | Status: AC
  Administered 2019-04-25: 2 g via INTRAVENOUS
  Filled 2019-04-25: qty 100

## 2019-04-25 MED ORDER — CEFAZOLIN SODIUM-DEXTROSE 2-4 GM/100ML-% IV SOLN
2.0000 g | Freq: Four times a day (QID) | INTRAVENOUS | Status: AC
  Administered 2019-04-25 (×2): 2 g via INTRAVENOUS
  Filled 2019-04-25 (×2): qty 100

## 2019-04-25 MED ORDER — OXYCODONE HCL 5 MG PO TABS
5.0000 mg | ORAL_TABLET | ORAL | Status: DC | PRN
  Administered 2019-04-25: 10 mg via ORAL
  Administered 2019-04-25: 5 mg via ORAL
  Administered 2019-04-26: 10 mg via ORAL
  Filled 2019-04-25: qty 2
  Filled 2019-04-25: qty 1
  Filled 2019-04-25 (×2): qty 2

## 2019-04-25 MED ORDER — BUPIVACAINE IN DEXTROSE 0.75-8.25 % IT SOLN
INTRATHECAL | Status: DC | PRN
  Administered 2019-04-25: 1.6 mL via INTRATHECAL

## 2019-04-25 MED ORDER — BUPIVACAINE LIPOSOME 1.3 % IJ SUSP
INTRAMUSCULAR | Status: DC | PRN
  Administered 2019-04-25: 20 mL

## 2019-04-25 MED ORDER — OXYCODONE HCL 5 MG PO TABS
10.0000 mg | ORAL_TABLET | ORAL | Status: DC | PRN
  Administered 2019-04-26 (×2): 15 mg via ORAL
  Filled 2019-04-25 (×2): qty 3

## 2019-04-25 MED ORDER — ONDANSETRON HCL 4 MG PO TABS: 4.0000 mg | ORAL_TABLET | Freq: Four times a day (QID) | ORAL | Status: DC | PRN

## 2019-04-25 MED ORDER — DOCUSATE SODIUM 100 MG PO CAPS
100.0000 mg | ORAL_CAPSULE | Freq: Two times a day (BID) | ORAL | Status: DC
  Administered 2019-04-25 – 2019-04-26 (×2): 100 mg via ORAL
  Filled 2019-04-25 (×2): qty 1

## 2019-04-25 MED ORDER — MEPERIDINE HCL 50 MG/ML IJ SOLN: 6.2500 mg | INTRAMUSCULAR | Status: DC | PRN

## 2019-04-25 MED ORDER — DEXAMETHASONE SODIUM PHOSPHATE 10 MG/ML IJ SOLN
8.0000 mg | Freq: Once | INTRAMUSCULAR | Status: AC
  Administered 2019-04-25: 8 mg via INTRAVENOUS

## 2019-04-25 MED ORDER — FENTANYL CITRATE (PF) 100 MCG/2ML IJ SOLN
50.0000 ug | INTRAMUSCULAR | Status: DC
  Administered 2019-04-25: 50 ug via INTRAVENOUS
  Filled 2019-04-25: qty 2

## 2019-04-25 MED ORDER — SODIUM CHLORIDE (PF) 0.9 % IJ SOLN
INTRAMUSCULAR | Status: AC
  Filled 2019-04-25: qty 50

## 2019-04-25 MED ORDER — PROPOFOL 500 MG/50ML IV EMUL
INTRAVENOUS | Status: AC
  Filled 2019-04-25: qty 50

## 2019-04-25 SURGICAL SUPPLY — 62 items
BAG SPEC THK2 15X12 ZIP CLS (MISCELLANEOUS) ×1
BAG ZIPLOCK 12X15 (MISCELLANEOUS) ×3 IMPLANT
BLADE SAG 18X100X1.27 (BLADE) ×3 IMPLANT
BLADE SAW SGTL 11.0X1.19X90.0M (BLADE) ×3 IMPLANT
BLADE SURG SZ10 CARB STEEL (BLADE) ×6 IMPLANT
BNDG ELASTIC 6X5.8 VLCR STR LF (GAUZE/BANDAGES/DRESSINGS) ×3 IMPLANT
BOWL SMART MIX CTS (DISPOSABLE) ×3 IMPLANT
CEMENT HV SMART SET (Cement) ×6 IMPLANT
CEMENT TIBIA MBT (Knees) ×1 IMPLANT
CLOSURE WOUND 1/2 X4 (GAUZE/BANDAGES/DRESSINGS) ×1
COVER SURGICAL LIGHT HANDLE (MISCELLANEOUS) ×3 IMPLANT
COVER WAND RF STERILE (DRAPES) IMPLANT
CUFF TOURN SGL QUICK 34 (TOURNIQUET CUFF) ×3
CUFF TRNQT CYL 34X4.125X (TOURNIQUET CUFF) ×1 IMPLANT
DECANTER SPIKE VIAL GLASS SM (MISCELLANEOUS) ×3 IMPLANT
DRAPE U-SHAPE 47X51 STRL (DRAPES) ×3 IMPLANT
DRSG ADAPTIC 3X8 NADH LF (GAUZE/BANDAGES/DRESSINGS) IMPLANT
DRSG AQUACEL AG ADV 3.5X10 (GAUZE/BANDAGES/DRESSINGS) IMPLANT
DRSG PAD ABDOMINAL 8X10 ST (GAUZE/BANDAGES/DRESSINGS) IMPLANT
DURAPREP 26ML APPLICATOR (WOUND CARE) ×3 IMPLANT
ELECT REM PT RETURN 15FT ADLT (MISCELLANEOUS) ×3 IMPLANT
EVACUATOR 1/8 PVC DRAIN (DRAIN) ×3 IMPLANT
FEMUR SIGMA PS SZ 4.0N R (Femur) ×3 IMPLANT
GAUZE SPONGE 2X2 8PLY STRL LF (GAUZE/BANDAGES/DRESSINGS) ×1 IMPLANT
GAUZE SPONGE 4X4 12PLY STRL (GAUZE/BANDAGES/DRESSINGS) ×3 IMPLANT
GLOVE BIO SURGEON STRL SZ7 (GLOVE) ×3 IMPLANT
GLOVE BIO SURGEON STRL SZ8 (GLOVE) ×3 IMPLANT
GLOVE BIOGEL PI IND STRL 6.5 (GLOVE) ×1 IMPLANT
GLOVE BIOGEL PI IND STRL 7.0 (GLOVE) ×1 IMPLANT
GLOVE BIOGEL PI IND STRL 8 (GLOVE) ×1 IMPLANT
GLOVE BIOGEL PI INDICATOR 6.5 (GLOVE) ×2
GLOVE BIOGEL PI INDICATOR 7.0 (GLOVE) ×2
GLOVE BIOGEL PI INDICATOR 8 (GLOVE) ×2
GLOVE SURG SS PI 6.5 STRL IVOR (GLOVE) ×3 IMPLANT
GOWN STRL REUS W/TWL LRG LVL3 (GOWN DISPOSABLE) ×9 IMPLANT
HANDPIECE INTERPULSE COAX TIP (DISPOSABLE) ×3
HOLDER FOLEY CATH W/STRAP (MISCELLANEOUS) IMPLANT
IMMOBILIZER KNEE 20 (SOFTGOODS) ×3
IMMOBILIZER KNEE 20 THIGH 36 (SOFTGOODS) ×1 IMPLANT
KIT TURNOVER KIT A (KITS) IMPLANT
MANIFOLD NEPTUNE II (INSTRUMENTS) ×3 IMPLANT
NS IRRIG 1000ML POUR BTL (IV SOLUTION) ×3 IMPLANT
PACK TOTAL KNEE CUSTOM (KITS) ×3 IMPLANT
PADDING CAST COTTON 6X4 STRL (CAST SUPPLIES) ×3 IMPLANT
PATELLA DOME PFC 38MM (Knees) ×3 IMPLANT
PENCIL SMOKE EVACUATOR (MISCELLANEOUS) IMPLANT
PIN STEINMAN FIXATION KNEE (PIN) ×3 IMPLANT
PLATE ROT INSERT 12.5MM SIZE 4 (Plate) ×3 IMPLANT
PROTECTOR NERVE ULNAR (MISCELLANEOUS) ×3 IMPLANT
SET HNDPC FAN SPRY TIP SCT (DISPOSABLE) ×1 IMPLANT
SPONGE GAUZE 2X2 STER 10/PKG (GAUZE/BANDAGES/DRESSINGS) ×2
STRIP CLOSURE SKIN 1/2X4 (GAUZE/BANDAGES/DRESSINGS) ×2 IMPLANT
SUT MNCRL AB 4-0 PS2 18 (SUTURE) ×3 IMPLANT
SUT STRATAFIX 0 PDS 27 VIOLET (SUTURE) ×3
SUT VIC AB 2-0 CT1 27 (SUTURE) ×9
SUT VIC AB 2-0 CT1 TAPERPNT 27 (SUTURE) ×3 IMPLANT
SUTURE STRATFX 0 PDS 27 VIOLET (SUTURE) ×1 IMPLANT
TIBIA MBT CEMENT (Knees) ×3 IMPLANT
TRAY FOLEY MTR SLVR 14FR STAT (SET/KITS/TRAYS/PACK) ×3 IMPLANT
WATER STERILE IRR 1000ML POUR (IV SOLUTION) ×6 IMPLANT
WRAP KNEE MAXI GEL POST OP (GAUZE/BANDAGES/DRESSINGS) ×3 IMPLANT
YANKAUER SUCT BULB TIP 10FT TU (MISCELLANEOUS) ×3 IMPLANT

## 2019-04-25 NOTE — Anesthesia Postprocedure Evaluation (Signed)
Anesthesia Post Note  Patient: Tina Clements  Procedure(s) Performed: TOTAL KNEE ARTHROPLASTY (Right Knee)     Patient location during evaluation: PACU Anesthesia Type: Spinal Level of consciousness: awake and alert Pain management: pain level controlled Vital Signs Assessment: post-procedure vital signs reviewed and stable Respiratory status: spontaneous breathing and respiratory function stable Cardiovascular status: blood pressure returned to baseline and stable Postop Assessment: spinal receding Anesthetic complications: no    Last Vitals:  Vitals:   04/25/19 1415 04/25/19 1433  BP: 138/73 (!) 141/87  Pulse: (!) 49 (!) 57  Resp: 13 15  Temp: 36.8 C 36.4 C  SpO2: 100% 100%    Last Pain:  Vitals:   04/25/19 1433  TempSrc: Oral  PainSc: 0-No pain                 Nolon Nations

## 2019-04-25 NOTE — Interval H&P Note (Signed)
History and Physical Interval Note:  04/25/2019 9:42 AM  Tina Clements  has presented today for surgery, with the diagnosis of right knee osteoarthritis.  The various methods of treatment have been discussed with the patient and family. After consideration of risks, benefits and other options for treatment, the patient has consented to  Procedure(s) with comments: TOTAL KNEE ARTHROPLASTY (Right) - 79min as a surgical intervention.  The patient's history has been reviewed, patient examined, no change in status, stable for surgery.  I have reviewed the patient's chart and labs.  Questions were answered to the patient's satisfaction.     Pilar Plate Tyrone Pautsch

## 2019-04-25 NOTE — Transfer of Care (Signed)
Immediate Anesthesia Transfer of Care Note  Patient: Tina Clements  Procedure(s) Performed: TOTAL KNEE ARTHROPLASTY (Right Knee)  Patient Location: PACU  Anesthesia Type:Spinal  Level of Consciousness: awake, alert  and oriented  Airway & Oxygen Therapy: Patient Spontanous Breathing and Patient connected to face mask oxygen  Post-op Assessment: Report given to RN and Post -op Vital signs reviewed and stable  Post vital signs: Reviewed and stable  Last Vitals:  Vitals Value Taken Time  BP    Temp    Pulse 60 04/25/19 1312  Resp    SpO2 100 % 04/25/19 1312  Vitals shown include unvalidated device data.  Last Pain:  Vitals:   04/25/19 0923  TempSrc:   PainSc: 2          Complications: No apparent anesthesia complications

## 2019-04-25 NOTE — Care Plan (Signed)
Ortho Bundle Case Management Note  Patient Details  Name: Tina Clements MRN: [id number] Date of Birth: 1940/08/14  R TKA on 04-25-19 DCP:  Home with spouse.  2 story home with 2 ste. DME:  No needs.  Has a RW and 3-in-1. PT: [facility]. PT eval scheduled on 04-28-19.                   DME Arranged:  N/A DME Agency:  NA  HH Arranged:  NA HH Agency:  NA  Additional Comments: Please contact me with any questions of if this plan should need to change.  Marianne Sofia, RN,CCM [facility]  [phone] 04/25/2019, 3:51 PM

## 2019-04-25 NOTE — Anesthesia Procedure Notes (Signed)
Spinal  Patient location during procedure: OR Start time: 04/25/2019 11:30 AM End time: 04/25/2019 11:34 AM Staffing Performed: resident/CRNA  Anesthesiologist: Nolon Nations, MD Resident/CRNA: Glory Buff, CRNA Preanesthetic Checklist Completed: patient identified, IV checked, site marked, risks and benefits discussed, surgical consent, monitors and equipment checked, pre-op evaluation and timeout performed Spinal Block Patient position: sitting Prep: DuraPrep Patient monitoring: heart rate, cardiac monitor, continuous pulse ox and blood pressure Approach: midline Location: L3-4 Injection technique: single-shot Needle Needle type: Pencan  Needle gauge: 24 G Needle length: 9 cm Assessment Sensory level: T4 Additional Notes Kit expiration date checked and padded.  Sterile prep and drape, skin local with 1% lidocaine, - heme, - paraesthesia, + CSF pre and post injection, patient tolerated procedure well.

## 2019-04-25 NOTE — Anesthesia Preprocedure Evaluation (Addendum)
Anesthesia Evaluation  Patient identified by MRN, date of birth, ID band Patient awake    Reviewed: Allergy & Precautions, NPO status , Patient's Chart, lab work & pertinent test results  Airway Mallampati: II  TM Distance: >3 FB Neck ROM: Full    Dental   Pulmonary shortness of breath,    Pulmonary exam normal breath sounds clear to auscultation       Cardiovascular Normal cardiovascular exam+ dysrhythmias  Rhythm:Regular Rate:Normal     Neuro/Psych negative neurological ROS  negative psych ROS   GI/Hepatic negative GI ROS, Neg liver ROS,   Endo/Other  negative endocrine ROS  Renal/GU negative Renal ROS     Musculoskeletal  (+) Arthritis ,   Abdominal   Peds  Hematology negative hematology ROS (+)   Anesthesia Other Findings   Reproductive/Obstetrics negative OB ROS                             Anesthesia Physical Anesthesia Plan  ASA: II  Anesthesia Plan: Spinal   Post-op Pain Management:  Regional for Post-op pain   Induction: Intravenous  PONV Risk Score and Plan: 3 and Ondansetron, Dexamethasone, Propofol infusion, TIVA and Treatment may vary due to age or medical condition  Airway Management Planned: Natural Airway  Additional Equipment:   Intra-op Plan:   Post-operative Plan:   Informed Consent: I have reviewed the patients History and Physical, chart, labs and discussed the procedure including the risks, benefits and alternatives for the proposed anesthesia with the patient or authorized representative who has indicated his/her understanding and acceptance.     Dental advisory given  Plan Discussed with: CRNA  Anesthesia Plan Comments:         Anesthesia Quick Evaluation

## 2019-04-25 NOTE — Anesthesia Procedure Notes (Signed)
Anesthesia Regional Block: Adductor canal block   Pre-Anesthetic Checklist: ,, timeout performed, Correct Patient, Correct Site, Correct Laterality, Correct Procedure, Correct Position, site marked, Risks and benefits discussed, Surgical consent,  Pre-op evaluation,  Post-op pain management  Laterality: Right  Prep: chloraprep       Needles:  Injection technique: Single-shot  Needle Type: Stimiplex     Needle Length: 9cm  Needle Gauge: 21     Additional Needles:   Procedures:,,,, ultrasound used (permanent image in chart),,,,  Narrative:  Start time: 04/25/2019 10:59 AM End time: 04/25/2019 11:04 AM Injection made incrementally with aspirations every 5 mL.  Performed by: Personally  Anesthesiologist: Nolon Nations, MD  Additional Notes: BP cuff, EKG monitors applied. Sedation begun. Artery and nerve location verified with U/S and anesthetic injected incrementally, slowly, and after negative aspirations under direct u/s guidance. Good fascial /perineural spread. Tolerated well.

## 2019-04-25 NOTE — Evaluation (Signed)
Physical Therapy Evaluation Patient Details Name: Tina Clements MRN: [id number] DOB: 1940-02-27 Today's Date: 04/25/2019   History of Present Illness  s/p R TKA PMH: sarcoidosis  Clinical Impression  Pt is s/p TKA resulting in the deficits listed below (see PT Problem List).  Pt amb 50' with RW and min assist, limited by mild fatigue and pain. Should continue to progress well in acute setting * Pt will benefit from skilled PT to increase their independence and safety with mobility to allow discharge to the venue listed below.      Follow Up Recommendations Follow surgeon's recommendation for DC plan and follow-up therapies    Equipment Recommendations  None recommended by PT    Recommendations for Other Services       Precautions / Restrictions Precautions Precautions: Fall;Knee Required Braces or Orthoses: Knee Immobilizer - Right Knee Immobilizer - Right: Discontinue once straight leg raise with < 10 degree lag Restrictions Weight Bearing Restrictions: No      Mobility  Bed Mobility Overal bed mobility: Needs Assistance Bed Mobility: Supine to Sit     Supine to sit: Min assist     General bed mobility comments: assist with RLE  Transfers Overall transfer level: Needs assistance Equipment used: Rolling walker (2 wheeled) Transfers: Sit to/from Stand Sit to Stand: Min assist         General transfer comment: cues for hand placement and RLE position  Ambulation/Gait Ambulation/Gait assistance: Min guard;Min assist Gait Distance (Feet): 50 Feet Assistive device: Rolling walker (2 wheeled) Gait Pattern/deviations: Step-to pattern;Decreased stance time - right     General Gait Details: cues for sequence and RW distance from self  Stairs            Wheelchair Mobility    Modified Rankin (Stroke Patients Only)       Balance                                             Pertinent Vitals/Pain Pain Assessment: 0-10 Pain Score:  4  Pain Location: right knee Pain Descriptors / Indicators: Aching;Grimacing Pain Intervention(s): Monitored during session;Limited activity within patient's tolerance;Repositioned    Home Living Family/patient expects to be discharged to:: Private residence Living Arrangements: Spouse/significant other Available Help at Discharge: Family Type of Home: House Home Access: Stairs to enter   Technical brewer of Steps: 1 Home Layout: Two level Home Equipment: Environmental consultant - 2 wheels;Crutches;Cane - single point Additional Comments: pt states she likely plans to go upstairs (pt reports she  did it after her knee scope 15 yrs ago)    Prior Function Level of Independence: Independent               Hand Dominance        Extremity/Trunk Assessment   Upper Extremity Assessment Upper Extremity Assessment: Overall WFL for tasks assessed    Lower Extremity Assessment Lower Extremity Assessment: RLE deficits/detail RLE Deficits / Details: ankle WFL; knee extension and hip flexion 2+/5; AAROM knee flexion 12 to 55 degrees, limited by post op pain       Communication   Communication: No difficulties  Cognition Arousal/Alertness: Awake/alert Behavior During Therapy: WFL for tasks assessed/performed Overall Cognitive Status: Within Functional Limits for tasks assessed  General Comments      Exercises Total Joint Exercises Ankle Circles/Pumps: 10 reps;Both;AROM Quad Sets: 5 reps;Both;AROM   Assessment/Plan    PT Assessment Patient needs continued PT services  PT Problem List Decreased strength;Decreased range of motion;Decreased activity tolerance;Pain;Decreased knowledge of use of DME;Decreased mobility       PT Treatment Interventions DME instruction;Gait training;Stair training;Functional mobility training;Therapeutic activities;Patient/family education;Therapeutic exercise    PT Goals (Current goals can be found  in the Care Plan section)  Acute Rehab PT Goals PT Goal Formulation: With patient Time For Goal Achievement: 05/02/19    Frequency 7X/week   Barriers to discharge        Co-evaluation               AM-PAC PT "6 Clicks" Mobility  Outcome Measure Help needed turning from your back to your side while in a flat bed without using bedrails?: A Little Help needed moving from lying on your back to sitting on the side of a flat bed without using bedrails?: A Little Help needed moving to and from a bed to a chair (including a wheelchair)?: A Little Help needed standing up from a chair using your arms (e.g., wheelchair or bedside chair)?: A Little Help needed to walk in hospital room?: A Little Help needed climbing 3-5 steps with a railing? : A Lot 6 Click Score: 17    End of Session Equipment Utilized During Treatment: Gait belt;Right knee immobilizer Activity Tolerance: Patient tolerated treatment well Patient left: in chair;with call bell/phone within reach;with chair alarm set   PT Visit Diagnosis: Difficulty in walking, not elsewhere classified (R26.2)    Time: [id number] PT Time Calculation (min) (ACUTE ONLY): 26 min   Charges:   PT Evaluation $PT Eval Low Complexity: 1 Low PT Treatments $Gait Training: 8-22 mins        Baxter Flattery, PT   Acute Rehab Dept [facility]): [id number]   04/25/2019   [facility] 04/25/2019, 6:45 PM

## 2019-04-25 NOTE — Progress Notes (Signed)
Assisted Dr. Nolon Nations with Right knee adductor canal  block. Side rails up, monitors on throughout procedure. See vital signs in flow sheet. Tolerated Procedure well.

## 2019-04-25 NOTE — Anesthesia Procedure Notes (Signed)
Date/Time: 04/25/2019 11:26 AM Performed by: Glory Buff, CRNA Oxygen Delivery Method: Simple face mask

## 2019-04-25 NOTE — Op Note (Signed)
OPERATIVE REPORT-TOTAL KNEE ARTHROPLASTY   Pre-operative diagnosis- Osteoarthritis  Right knee(s)  Post-operative diagnosis- Osteoarthritis Right knee(s)  Procedure-  Right  Total Knee Arthroplasty  Surgeon- Dione Plover. Anjoli Diemer, MD  Assistant- Ardeen Jourdain, PA-C   Anesthesia-  Adductor canal block and spinal  EBL-25 mL   Drains Hemovac  Tourniquet time-  Total Tourniquet Time Documented: Thigh (Right) - 39 minutes Total: Thigh (Right) - 39 minutes     Complications- None  Condition-PACU - hemodynamically stable.   Brief Clinical Note  Tina Clements is a 79 y.o. year old female with end stage OA of her right knee with progressively worsening pain and dysfunction. She has constant pain, with activity and at rest and significant functional deficits with difficulties even with ADLs. She has had extensive non-op management including analgesics, injections of cortisone and viscosupplements, and home exercise program, but remains in significant pain with significant dysfunction.Radiographs show bone on bone arthritis lateral and patellofemoral. She presents now for right Total Knee Arthroplasty.    Procedure in detail---   The patient is brought into the operating room and positioned supine on the operating table. After successful administration of  Adductor canal block and spinal,   a tourniquet is placed high on the  Right thigh(s) and the lower extremity is prepped and draped in the usual sterile fashion. Time out is performed by the operating team and then the  Right lower extremity is wrapped in Esmarch, knee flexed and the tourniquet inflated to 300 mmHg.       A midline incision is made with a ten blade through the subcutaneous tissue to the level of the extensor mechanism. A fresh blade is used to make a medial parapatellar arthrotomy. Soft tissue over the proximal medial tibia is subperiosteally elevated to the joint line with a knife and into the semimembranosus bursa  with a Cobb elevator. Soft tissue over the proximal lateral tibia is elevated with attention being paid to avoiding the patellar tendon on the tibial tubercle. The patella is everted, knee flexed 90 degrees and the ACL and PCL are removed. Findings are bone on bone lateral and patellofemoral with large global osteophytes        The drill is used to create a starting hole in the distal femur and the canal is thoroughly irrigated with sterile saline to remove the fatty contents. The 5 degree Right  valgus alignment guide is placed into the femoral canal and the distal femoral cutting block is pinned to remove 10 mm off the distal femur. Resection is made with an oscillating saw.      The tibia is subluxed forward and the menisci are removed. The extramedullary alignment guide is placed referencing proximally at the medial aspect of the tibial tubercle and distally along the second metatarsal axis and tibial crest. The block is pinned to remove 75mm off the more deficient lateral  side. Resection is made with an oscillating saw. Size 3is the most appropriate size for the tibia and the proximal tibia is prepared with the modular drill and keel punch for that size.      The femoral sizing guide is placed and size 4 is most appropriate. Rotation is marked off the epicondylar axis and confirmed by creating a rectangular flexion gap at 90 degrees. The size 4 cutting block is pinned in this rotation and the anterior, posterior and chamfer cuts are made with the oscillating saw. The intercondylar block is then placed and that cut is made.  Trial size 3 tibial component, trial size 4 narrow posterior stabilized femur and a 12.5  mm posterior stabilized rotating platform insert trial is placed. Full extension is achieved with excellent varus/valgus and anterior/posterior balance throughout full range of motion. The patella is everted and thickness measured to be 22  mm. Free hand resection is taken to 12 mm, a 35  template is placed, lug holes are drilled, trial patella is placed, and it tracks normally. Osteophytes are removed off the posterior femur with the trial in place. All trials are removed and the cut bone surfaces prepared with pulsatile lavage. Cement is mixed and once ready for implantation, the size 3 tibial implant, size  4 narrow posterior stabilized femoral component, and the size 35 patella are cemented in place and the patella is held with the clamp. The trial insert is placed and the knee held in full extension. The Exparel (20 ml mixed with 60 ml saline) is injected into the extensor mechanism, posterior capsule, medial and lateral gutters and subcutaneous tissues.  All extruded cement is removed and once the cement is hard the permanent 12.5 mm posterior stabilized rotating platform insert is placed into the tibial tray.      The wound is copiously irrigated with saline solution and the extensor mechanism closed over a hemovac drain with #1 V-loc suture. The tourniquet is released for a total tourniquet time of 39  minutes. Flexion against gravity is 140 degrees and the patella tracks normally. Subcutaneous tissue is closed with 2.0 vicryl and subcuticular with running 4.0 Monocryl. The incision is cleaned and dried and steri-strips and a bulky sterile dressing are applied. The limb is placed into a knee immobilizer and the patient is awakened and transported to recovery in stable condition.      Please note that a surgical assistant was a medical necessity for this procedure in order to perform it in a safe and expeditious manner. Surgical assistant was necessary to retract the ligaments and vital neurovascular structures to prevent injury to them and also necessary for proper positioning of the limb to allow for anatomic placement of the prosthesis.   Dione Plover Ren Aspinall, MD    04/25/2019, 12:50 PM

## 2019-04-25 NOTE — Discharge Instructions (Addendum)
 Frank Aluisio, MD Total Joint Specialist [facility] [place] [address] [place],  [address] [phone]  TOTAL KNEE REPLACEMENT POSTOPERATIVE DIRECTIONS    Knee Rehabilitation, Guidelines Following Surgery  Results after knee surgery are often greatly improved when you follow the exercise, range of motion and muscle strengthening exercises prescribed by your doctor. Safety measures are also important to protect the knee from further injury. If any of these exercises cause you to have increased pain or swelling in your knee joint, decrease the amount until you are comfortable again and slowly increase them. If you have problems or questions, call your caregiver or physical therapist for advice.   BLOOD CLOT PREVENTION . Take a 325 mg Aspirin two times a day for three weeks following surgery. Then resume one 81 mg Aspirin once a day. . You may resume your vitamins/supplements upon discharge from the hospital. . Do not take any NSAIDs (Advil, Aleve, Ibuprofen, Meloxicam, etc.) until you have discontinued the 325 mg Aspirin.  HOME CARE INSTRUCTIONS  . Remove items at home which could result in a fall. This includes throw rugs or furniture in walking pathways.  . ICE to the affected knee as much as tolerated. Icing helps control swelling. If the swelling is well controlled you will be more comfortable and rehab easier. Continue to use ice on the knee for pain and swelling from surgery. You may notice swelling that will progress down to the foot and ankle. This is normal after surgery. Elevate the leg when you are not up walking on it.    . Continue to use the breathing machine which will help keep your temperature down. It is common for your temperature to cycle up and down following surgery, especially at night when you are not up moving around and exerting yourself. The breathing machine keeps your lungs expanded and your temperature down. . Do not place pillow  under the operative knee, focus on keeping the knee straight while resting  DIET You may resume your previous home diet once you are discharged from the hospital.  DRESSING / WOUND CARE / SHOWERING . Keep your bulky bandage on for 2 days. On the third post-operative day you may remove the Ace bandage and gauze. There is a waterproof adhesive bandage on your skin which will stay in place until your first follow-up appointment. Once you remove this you will not need to place another bandage . You may begin showering 3 days following surgery, but do not submerge the incision under water.  ACTIVITY For the first 5 days, the key is rest and control of pain and swelling . Do your home exercises twice a day starting on post-operative day 3. On the days you go to physical therapy, just do the home exercises once that day. . You should rest, ice and elevate the leg for 50 minutes out of every hour. Get up and walk/stretch for 10 minutes per hour. After 5 days you can increase your activity slowly as tolerated. . Walk with your walker as instructed. Use the walker until you are comfortable transitioning to a cane. Walk with the cane in the opposite hand of the operative leg. You may discontinue the cane once you are comfortable and walking steadily. . Avoid periods of inactivity such as sitting longer than an hour when not asleep. This helps prevent blood clots.  . You may discontinue the knee immobilizer once you are able to perform a straight leg raise while lying down. .   You may resume a sexual relationship in one month or when given the OK by your doctor.  . You may return to work once you are cleared by your doctor.  . Do not drive a car for 6 weeks or until released by you surgeon.  . Do not drive while taking narcotics.  TED HOSE STOCKINGS Wear the elastic stockings on both legs for three weeks following surgery during the day. You may remove them at night for sleeping.  WEIGHT BEARING Weight  bearing as tolerated with assist device (walker, cane, etc) as directed, use it as long as suggested by your surgeon or therapist, typically at least 4-6 weeks.  POSTOPERATIVE CONSTIPATION PROTOCOL Constipation - defined medically as fewer than three stools per week and severe constipation as less than one stool per week.  One of the most common issues patients have following surgery is constipation.  Even if you have a regular bowel pattern at home, your normal regimen is likely to be disrupted due to multiple reasons following surgery.  Combination of anesthesia, postoperative narcotics, change in appetite and fluid intake all can affect your bowels.  In order to avoid complications following surgery, here are some recommendations in order to help you during your recovery period.  . Colace (docusate) - Pick up an over-the-counter form of Colace or another stool softener and take twice a day as long as you are requiring postoperative pain medications.  Take with a full glass of water daily.  If you experience loose stools or diarrhea, hold the colace until you stool forms back up. If your symptoms do not get better within 1 week or if they get worse, check with your doctor. . Dulcolax (bisacodyl) - Pick up over-the-counter and take as directed by the product packaging as needed to assist with the movement of your bowels.  Take with a full glass of water.  Use this product as needed if not relieved by Colace only.  . MiraLax (polyethylene glycol) - Pick up over-the-counter to have on hand. MiraLax is a solution that will increase the amount of water in your bowels to assist with bowel movements.  Take as directed and can mix with a glass of water, juice, soda, coffee, or tea. Take if you go more than two days without a movement. Do not use MiraLax more than once per day. Call your doctor if you are still constipated or irregular after using this medication for 7 days in a row.  If you continue to have  problems with postoperative constipation, please contact the office for further assistance and recommendations.  If you experience "the worst abdominal pain ever" or develop nausea or vomiting, please contact the office immediatly for further recommendations for treatment.  ITCHING If you experience itching with your medications, try taking only a single pain pill, or even half a pain pill at a time.  You can also use Benadryl over the counter for itching or also to help with sleep.   MEDICATIONS See your medication summary on the "After Visit Summary" that the nursing staff will review with you prior to discharge.  You may have some home medications which will be placed on hold until you complete the course of blood thinner medication.  It is important for you to complete the blood thinner medication as prescribed by your surgeon.  Continue your approved medications as instructed at time of discharge.  PRECAUTIONS . If you experience chest pain or shortness of breath - call 911 immediately for  transfer to the hospital emergency department.  . If you develop a fever greater that 101 F, purulent drainage from wound, increased redness or drainage from wound, foul odor from the wound/dressing, or calf pain - CONTACT YOUR SURGEON.                                                   FOLLOW-UP APPOINTMENTS Make sure you keep all of your appointments after your operation with your surgeon and caregivers. You should call the office at the above phone number and make an appointment for approximately two weeks after the date of your surgery or on the date instructed by your surgeon outlined in the "After Visit Summary".  RANGE OF MOTION AND STRENGTHENING EXERCISES  Rehabilitation of the knee is important following a knee injury or an operation. After just a few days of immobilization, the muscles of the thigh which control the knee become weakened and shrink (atrophy). Knee exercises are designed to build up the  tone and strength of the thigh muscles and to improve knee motion. Often times heat used for twenty to thirty minutes before working out will loosen up your tissues and help with improving the range of motion but do not use heat for the first two weeks following surgery. These exercises can be done on a training (exercise) mat, on the floor, on a table or on a bed. Use what ever works the best and is most comfortable for you Knee exercises include:  . Leg Lifts - While your knee is still immobilized in a splint or cast, you can do straight leg raises. Lift the leg to 60 degrees, hold for 3 sec, and slowly lower the leg. Repeat 10-20 times 2-3 times daily. Perform this exercise against resistance later as your knee gets better.  . Quad and Hamstring Sets - Tighten up the muscle on the front of the thigh (Quad) and hold for 5-10 sec. Repeat this 10-20 times hourly. Hamstring sets are done by pushing the foot backward against an object and holding for 5-10 sec. Repeat as with quad sets.   Leg Slides: Lying on your back, slowly slide your foot toward your buttocks, bending your knee up off the floor (only go as far as is comfortable). Then slowly slide your foot back down until your leg is flat on the floor again.  Angel Wings: Lying on your back spread your legs to the side as far apart as you can without causing discomfort.  A rehabilitation program following serious knee injuries can speed recovery and prevent re-injury in the future due to weakened muscles. Contact your doctor or a physical therapist for more information on knee rehabilitation.   IF YOU ARE TRANSFERRED TO A SKILLED REHAB FACILITY If the patient is transferred to a skilled rehab facility following release from the hospital, a list of the current medications will be sent to the facility for the patient to continue.  When discharged from the skilled rehab facility, please have the facility set up the patient's Home Health Physical Therapy  prior to being released. Also, the skilled facility will be responsible for providing the patient with their medications at time of release from the facility to include their pain medication, the muscle relaxants, and their blood thinner medication. If the patient is still at the rehab facility at time of the   two week follow up appointment, the skilled rehab facility will also need to assist the patient in arranging follow up appointment in our office and any transportation needs.  MAKE SURE YOU:  . Understand these instructions.  . Get help right away if you are not doing well or get worse.    Pick up stool softner and laxative for home use following surgery while on pain medications. Do not submerge incision under water. Please use good hand washing techniques while changing dressing each day. May shower starting three days after surgery. Please use a clean towel to pat the incision dry following showers. Continue to use ice for pain and swelling after surgery. Do not use any lotions or creams on the incision until instructed by your surgeon.

## 2019-04-26 ENCOUNTER — Encounter: Payer: Self-pay | Admitting: *Deleted

## 2019-04-26 DIAGNOSIS — M25761 Osteophyte, right knee: Secondary | ICD-10-CM | POA: Diagnosis not present

## 2019-04-26 DIAGNOSIS — M1711 Unilateral primary osteoarthritis, right knee: Secondary | ICD-10-CM | POA: Diagnosis not present

## 2019-04-26 DIAGNOSIS — Z881 Allergy status to other antibiotic agents status: Secondary | ICD-10-CM | POA: Diagnosis not present

## 2019-04-26 DIAGNOSIS — D869 Sarcoidosis, unspecified: Secondary | ICD-10-CM | POA: Diagnosis not present

## 2019-04-26 DIAGNOSIS — Z7982 Long term (current) use of aspirin: Secondary | ICD-10-CM | POA: Diagnosis not present

## 2019-04-26 DIAGNOSIS — M8580 Other specified disorders of bone density and structure, unspecified site: Secondary | ICD-10-CM | POA: Diagnosis not present

## 2019-04-26 LAB — BASIC METABOLIC PANEL
Anion gap: 6 (ref 5–15)
BUN: 11 mg/dL (ref 8–23)
CO2: 26 mmol/L (ref 22–32)
Calcium: 8.8 mg/dL — ABNORMAL LOW (ref 8.9–10.3)
Chloride: 108 mmol/L (ref 98–111)
Creatinine, Ser: 0.83 mg/dL (ref 0.44–1.00)
GFR calc Af Amer: >60 mL/min (ref >60)
GFR calc non Af Amer: >60 mL/min (ref >60)
Glucose, Bld: 146 mg/dL — ABNORMAL HIGH (ref 70–99)
Potassium: 4.5 mmol/L (ref 3.5–5.1)
Sodium: 140 mmol/L (ref 135–145)

## 2019-04-26 LAB — CBC
HCT: 30.8 % — ABNORMAL LOW (ref 36.0–46.0)
Hemoglobin: 9.4 g/dL — ABNORMAL LOW (ref 12.0–15.0)
MCH: 25.3 pg — ABNORMAL LOW (ref 26.0–34.0)
MCHC: 30.5 g/dL (ref 30.0–36.0)
MCV: 82.8 fL (ref 80.0–100.0)
Platelets: 184 10*3/uL (ref 150–400)
RBC: 3.72 MIL/uL — ABNORMAL LOW (ref 3.87–5.11)
RDW: 14.1 % (ref 11.5–15.5)
WBC: 9.3 10*3/uL (ref 4.0–10.5)
nRBC: 0 % (ref 0.0–0.2)

## 2019-04-26 MED ORDER — METHOCARBAMOL 500 MG PO TABS: 500.0000 mg | ORAL_TABLET | Freq: Four times a day (QID) | ORAL | 0 refills | Status: DC | PRN

## 2019-04-26 MED ORDER — OXYCODONE HCL 5 MG PO TABS: 5.0000 mg | ORAL_TABLET | Freq: Four times a day (QID) | ORAL | 0 refills | Status: DC | PRN

## 2019-04-26 MED ORDER — TRAMADOL HCL 50 MG PO TABS: 50.0000 mg | ORAL_TABLET | Freq: Four times a day (QID) | ORAL | 0 refills | Status: DC | PRN

## 2019-04-26 MED ORDER — ASPIRIN 325 MG PO TBEC: 325.0000 mg | DELAYED_RELEASE_TABLET | Freq: Two times a day (BID) | ORAL | 0 refills | Status: AC

## 2019-04-26 NOTE — Progress Notes (Signed)
   Subjective: 1 Day Post-Op Procedure(s) (LRB): TOTAL KNEE ARTHROPLASTY (Right) Patient reports pain as mild.   Patient seen in rounds by Dr. Wynelle Link. Patient is well, and has had no acute complaints or problems. No issues overnight. Denies chest pain, SOB, or calf pain. Foley catheter to be removed this AM. We will continue therapy today, ambulated 30' yesterday.   Objective: Vital signs in last 24 hours: Temp:  [97.6 F (36.4 C)-98.9 F (37.2 C)] 98.9 F (37.2 C) (03/09 0522) Pulse Rate:  [49-85] 62 (03/09 0522) Resp:  [13-23] 18 (03/09 0522) BP: (114-157)/(57-100) 124/76 (03/09 0522) SpO2:  [97 %-100 %] 100 % (03/09 0522) Weight:  [75.9 kg] 75.9 kg (03/08 0927)  Intake/Output from previous day:  Intake/Output Summary (Last 24 hours) at 04/26/2019 0710 Last data filed at 04/26/2019 0600 Gross per 24 hour  Intake 3666.14 ml  Output 2500 ml  Net 1166.14 ml    Labs: Recent Labs    04/26/19 0314  HGB 9.4*   Recent Labs    04/26/19 0314  WBC 9.3  RBC 3.72*  HCT 30.8*  PLT 184   Recent Labs    04/26/19 0314  NA 140  K 4.5  CL 108  CO2 26  BUN 11  CREATININE 0.83  GLUCOSE 146*  CALCIUM 8.8*   Exam: General - Patient is Alert and Oriented Extremity - Neurologically intact Neurovascular intact Sensation intact distally Dorsiflexion/Plantar flexion intact Dressing - dressing C/D/I Motor Function - intact, moving foot and toes well on exam.   Past Medical History:  Diagnosis Date  . Arthritis   . Dyspnea    with excertion  . Dysrhythmia 2000   paplatations and fast  . Hyperlipidemia   . Sarcoidosis     Assessment/Plan: 1 Day Post-Op Procedure(s) (LRB): TOTAL KNEE ARTHROPLASTY (Right) Principal Problem:   OA (osteoarthritis) of knee Active Problems:   S/P total knee arthroplasty, right  Estimated body mass index is 27 kg/m as calculated from the following:   Height as of this encounter: 5\' 6"  (1.676 m).   Weight as of this encounter: 75.9  kg. Advance diet Up with therapy D/C IV fluids  Anticipated LOS equal to or greater than 2 midnights due to - Age 26 and older with one or more of the following:  - Obesity  - Expected need for hospital services (PT, OT, Nursing) required for safe  discharge  - Anticipated need for postoperative skilled nursing care or inpatient rehab  - Active co-morbidities: None OR   - Unanticipated findings during/Post Surgery: None  - Patient is a high risk of re-admission due to: None    DVT Prophylaxis - Aspirin Weight bearing as tolerated. D/C O2 and pulse ox and try on room air. Hemovac pulled without difficulty, will continue therapy today.  Plan is to go Home after hospital stay. Plan for discharge later today after two sessions of therapy if meeting goals. Scheduled for outpatient PT at [facility]. Follow-up in the office in 2 weeks.   Theresa Duty, PA-C Orthopedic Surgery [phone] 04/26/2019, 7:10 AM

## 2019-04-26 NOTE — Care Management Obs Status (Signed)
[place] NOTIFICATION   Patient Details  Name: Tina Clements MRN: [id number] Date of Birth: 1940/05/18   Medicare Observation Status Notification Given:  Yes    Leeroy Cha, RN 04/26/2019, 1:48 PM

## 2019-04-26 NOTE — Progress Notes (Signed)
Physical Therapy Treatment Patient Details Name: Tina Clements MRN: [id number] DOB: July 19, 1940 Today's Date: 04/26/2019    History of Present Illness s/p R TKA PMH: sarcoidosis    PT Comments    POD # 1 am session Pt unable to perform SLR so reinforced KI.  Assisted OOB to amb to bathroom then a short distance in hallway.  Performed a few TE's followed by ICE.   Follow Up Recommendations  Follow surgeon's recommendation for DC plan and follow-up therapies     Equipment Recommendations  None recommended by PT    Recommendations for Other Services       Precautions / Restrictions Precautions Precautions: Fall;Knee Precaution Comments: instructed no pillow under knee Required Braces or Orthoses: Knee Immobilizer - Right Knee Immobilizer - Right: Discontinue once straight leg raise with < 10 degree lag Restrictions Weight Bearing Restrictions: No Other Position/Activity Restrictions: WBAT    Mobility  Bed Mobility Overal bed mobility: Needs Assistance Bed Mobility: Supine to Sit     Supine to sit: Min assist     General bed mobility comments: demonstarted and instructed how to use a belt to self assist LE off bed but she still required zmin Assist  Transfers Overall transfer level: Needs assistance Equipment used: Rolling walker (2 wheeled) Transfers: Sit to/from Stand Sit to Stand: Min assist         General transfer comment: 50% VC's on proper hand placement and turn completion.  Also assisted with toilet transfer.  Ambulation/Gait Ambulation/Gait assistance: Min guard;Min assist Gait Distance (Feet): 32 Feet Assistive device: Rolling walker (2 wheeled) Gait Pattern/deviations: Step-to pattern;Decreased stance time - right Gait velocity: decreased   General Gait Details: 50% cues for sequence and RW distance from self.  Also assisted with toilet transfer.  Unsteady.   Stairs             Wheelchair Mobility    Modified Rankin (Stroke  Patients Only)       Balance                                            Cognition Arousal/Alertness: Awake/alert Behavior During Therapy: WFL for tasks assessed/performed Overall Cognitive Status: Within Functional Limits for tasks assessed                                 General Comments: AxO x 3 slightly groggy      Exercises      General Comments        Pertinent Vitals/Pain Pain Assessment: 0-10 Pain Score: 5  Pain Location: right knee Pain Descriptors / Indicators: Aching;Grimacing Pain Intervention(s): Monitored during session;Premedicated before session;Repositioned;Ice applied    Home Living                      Prior Function            PT Goals (current goals can now be found in the care plan section) Progress towards PT goals: Progressing toward goals    Frequency    7X/week      PT Plan Current plan remains appropriate    Co-evaluation              AM-PAC PT "6 Clicks" Mobility   Outcome Measure  Help needed turning from your back to your side  while in a flat bed without using bedrails?: A Little Help needed moving from lying on your back to sitting on the side of a flat bed without using bedrails?: A Little Help needed moving to and from a bed to a chair (including a wheelchair)?: A Little Help needed standing up from a chair using your arms (e.g., wheelchair or bedside chair)?: A Little Help needed to walk in hospital room?: A Little Help needed climbing 3-5 steps with a railing? : A Lot 6 Click Score: 17    End of Session Equipment Utilized During Treatment: Gait belt;Right knee immobilizer Activity Tolerance: Patient tolerated treatment well Patient left: in chair;with call bell/phone within reach;with chair alarm set Nurse Communication: Mobility status(pt will need 2 PT sessions prior to D/C today) PT Visit Diagnosis: Difficulty in walking, not elsewhere classified (R26.2)      Time: 1020-1045 PT Time Calculation (min) (ACUTE ONLY): 25 min  Charges:  $Gait Training: 8-22 mins $Therapeutic Activity: 8-22 mins                     Rica Koyanagi  PTA Acute  Rehabilitation Services Pager      [phone] Office      [phone]

## 2019-04-26 NOTE — Plan of Care (Signed)
  Problem: Education: Goal: Knowledge of the prescribed therapeutic regimen will improve 04/26/2019 1611 by Hubert Azure, RN Outcome: Adequate for Discharge 04/26/2019 0826 by Hubert Azure, RN Outcome: Progressing Goal: Individualized Educational Video(s) 04/26/2019 1611 by Hubert Azure, RN Outcome: Adequate for Discharge 04/26/2019 0826 by Hubert Azure, RN Outcome: Progressing   Problem: Activity: Goal: Ability to avoid complications of mobility impairment will improve 04/26/2019 1611 by Hubert Azure, RN Outcome: Adequate for Discharge 04/26/2019 0826 by Hubert Azure, RN Outcome: Progressing Goal: Range of joint motion will improve 04/26/2019 1611 by Hubert Azure, RN Outcome: Adequate for Discharge 04/26/2019 0826 by Hubert Azure, RN Outcome: Progressing   Problem: Clinical Measurements: Goal: Postoperative complications will be avoided or minimized 04/26/2019 1611 by Hubert Azure, RN Outcome: Adequate for Discharge 04/26/2019 0826 by Hubert Azure, RN Outcome: Progressing   Problem: Pain Management: Goal: Pain level will decrease with appropriate interventions 04/26/2019 1611 by Hubert Azure, RN Outcome: Adequate for Discharge 04/26/2019 0826 by Hubert Azure, RN Outcome: Progressing   Problem: Skin Integrity: Goal: Will show signs of wound healing Outcome: Adequate for Discharge

## 2019-04-26 NOTE — Progress Notes (Signed)
Physical Therapy Treatment Patient Details Name: Tina Clements MRN: [id number] DOB: 12/20/1940 Today's Date: 04/26/2019    History of Present Illness s/p R TKA PMH: sarcoidosis    PT Comments    POD # 1 pm session Assisted out of recliner to amb in hallway.  General Gait Details: 25% cues for sequence and RW distance from self.  General stair comments: 50% VC's on proper walker placement and proper sequencing up one step THREE TIMES to practice. General stair comments: 50% VC's on proper walker placement and proper sequencing up one step THREE TIMES to practice. Then returned to room to perform some TE's following HEP handout.  Instructed on proper tech, freq as well as use of ICE.   Addressed all mobility questions, discussed appropriate activity, educated on use of ICE.  Pt ready for D/C to home.   Follow Up Recommendations  Follow surgeon's recommendation for DC plan and follow-up therapies     Equipment Recommendations  None recommended by PT    Recommendations for Other Services       Precautions / Restrictions Precautions Precautions: Fall;Knee Precaution Comments: instructed no pillow under knee Required Braces or Orthoses: Knee Immobilizer - Right Knee Immobilizer - Right: Discontinue once straight leg raise with < 10 degree lag Restrictions Weight Bearing Restrictions: No Other Position/Activity Restrictions: WBAT    Mobility  Bed Mobility Overal bed mobility: Needs Assistance Bed Mobility: Sit to Supine     Supine to sit: Min assist Sit to supine: Min assist   General bed mobility comments: assisted back to bed supporting R LE  Transfers Overall transfer level: Needs assistance Equipment used: Rolling walker (2 wheeled) Transfers: Sit to/from Stand Sit to Stand: Min assist         General transfer comment: 50% VC's on proper hand placement and turn completion.  Ambulation/Gait Ambulation/Gait assistance: Min guard;Min assist Gait Distance (Feet):  47 Feet Assistive device: Rolling walker (2 wheeled) Gait Pattern/deviations: Step-to pattern;Decreased stance time - right Gait velocity: decreased   General Gait Details: 25% cues for sequence and RW distance from self.  Also assisted with toilet transfer.  Unsteady.   Stairs Stairs: Yes Stairs assistance: Min guard Stair Management: No rails;Step to pattern;Forwards;With walker Number of Stairs: 1 General stair comments: 50% VC's on proper walker placement and proper sequencing up one step THREE TIMES to practice.   Wheelchair Mobility    Modified Rankin (Stroke Patients Only)       Balance                                            Cognition Arousal/Alertness: Awake/alert Behavior During Therapy: WFL for tasks assessed/performed Overall Cognitive Status: Within Functional Limits for tasks assessed                                 General Comments: AxO x 3 slightly groggy      Exercises  10 reps AP 5 reps knee presses 5 reps towel squeezes 5 reps HS AAROM 5 reps SLR AAROM    General Comments        Pertinent Vitals/Pain Pain Assessment: 0-10 Pain Score: 5  Pain Location: right knee Pain Descriptors / Indicators: Aching;Grimacing Pain Intervention(s): Monitored during session;Premedicated before session;Repositioned;Ice applied    Home Living  Prior Function            PT Goals (current goals can now be found in the care plan section) Progress towards PT goals: Progressing toward goals    Frequency    7X/week      PT Plan Current plan remains appropriate    Co-evaluation              AM-PAC PT "6 Clicks" Mobility   Outcome Measure  Help needed turning from your back to your side while in a flat bed without using bedrails?: A Little Help needed moving from lying on your back to sitting on the side of a flat bed without using bedrails?: A Little Help needed moving to and  from a bed to a chair (including a wheelchair)?: A Little Help needed standing up from a chair using your arms (e.g., wheelchair or bedside chair)?: A Little Help needed to walk in hospital room?: A Little Help needed climbing 3-5 steps with a railing? : A Lot 6 Click Score: 17    End of Session Equipment Utilized During Treatment: Gait belt;Right knee immobilizer Activity Tolerance: Patient tolerated treatment well Patient left: in bed;with call bell/phone within reach Nurse Communication: Mobility status(pt ready for D/C to home with spouse) PT Visit Diagnosis: Difficulty in walking, not elsewhere classified (R26.2)     Time: [id number] PT Time Calculation (min) (ACUTE ONLY): 28 min  Charges:  $Gait Training: 8-22 mins $Therapeutic Exercise: 8-22 mins                      Rica Koyanagi  PTA Acute  Rehabilitation Services Pager      [phone] Office      [phone]

## 2019-04-26 NOTE — Plan of Care (Signed)

## 2019-04-26 NOTE — Care Management CC44 (Signed)
Condition Code 44 Documentation Completed  Patient Details  Name: Tina Clements MRN: [id number] Date of Birth: 04/27/1940   Condition Code 44 given:  Yes Patient signature on Condition Code 44 notice:  Yes Documentation of 2 MD's agreement:  Yes Code 44 added to claim:  Yes    Leeroy Cha, RN 04/26/2019, 1:48 PM

## 2019-04-27 NOTE — Discharge Summary (Signed)
Physician Discharge Summary   Patient ID: Tina Clements MRN: [id number] DOB/AGE: [birth year] 80 y.o.  Admit date: 04/25/2019 Discharge date: 04/26/2019  Primary Diagnosis: Osteoarthritis, right knee   Admission Diagnoses:  Past Medical History:  Diagnosis Date  . Arthritis   . Dyspnea    with excertion  . Dysrhythmia 2000   paplatations and fast  . Hyperlipidemia   . Sarcoidosis    Discharge Diagnoses:   Principal Problem:   OA (osteoarthritis) of knee Active Problems:   S/P total knee arthroplasty, right  Estimated body mass index is 27 kg/m as calculated from the following:   Height as of this encounter: 5\' 6"  (1.676 m).   Weight as of this encounter: 75.9 kg.  Procedure:  Procedure(s) (LRB): TOTAL KNEE ARTHROPLASTY (Right)   Consults: None  HPI: Tina Clements is a 79 y.o. year old female with end stage OA of her right knee with progressively worsening pain and dysfunction. She has constant pain, with activity and at rest and significant functional deficits with difficulties even with ADLs. She has had extensive non-op management including analgesics, injections of cortisone and viscosupplements, and home exercise program, but remains in significant pain with significant dysfunction.Radiographs show bone on bone arthritis lateral and patellofemoral. She presents now for right Total Knee Arthroplasty.   Laboratory Data: Admission on 04/25/2019, Discharged on 04/26/2019  Component Date Value Ref Range Status  . WBC 04/26/2019 9.3  4.0 - 10.5 K/uL Final  . RBC 04/26/2019 3.72* 3.87 - 5.11 MIL/uL Final  . Hemoglobin 04/26/2019 9.4* 12.0 - 15.0 g/dL Final  . HCT 04/26/2019 30.8* 36.0 - 46.0 % Final  . MCV 04/26/2019 82.8  80.0 - 100.0 fL Final  . MCH 04/26/2019 25.3* 26.0 - 34.0 pg Final  . MCHC 04/26/2019 30.5  30.0 - 36.0 g/dL Final  . RDW 04/26/2019 14.1  11.5 - 15.5 % Final  . Platelets 04/26/2019 184  150 - 400 K/uL Final  . nRBC 04/26/2019 0.0  0.0 - 0.2 %  Final   Performed at [facility], [place] [address] [place], [place] [address]  . Sodium 04/26/2019 140  135 - 145 mmol/L Final  . Potassium 04/26/2019 4.5  3.5 - 5.1 mmol/L Final  . Chloride 04/26/2019 108  98 - 111 mmol/L Final  . CO2 04/26/2019 26  22 - 32 mmol/L Final  . Glucose, Bld 04/26/2019 146* 70 - 99 mg/dL Final   Glucose reference range applies only to samples taken after fasting for at least 8 hours.  . BUN 04/26/2019 11  8 - 23 mg/dL Final  . Creatinine, Ser 04/26/2019 0.83  0.44 - 1.00 mg/dL Final  . Calcium 04/26/2019 8.8* 8.9 - 10.3 mg/dL Final  . GFR calc non Af Amer 04/26/2019 >60  >60 mL/min Final  . GFR calc Af Amer 04/26/2019 >60  >60 mL/min Final  . Anion gap 04/26/2019 6  5 - 15 Final   Performed at [facility], [place] [address] [place], [place] [address]  Hospital Outpatient Visit on 04/22/2019  Component Date Value Ref Range Status  . SARS Coronavirus 2 04/22/2019 NEGATIVE  NEGATIVE Final   Comment: (NOTE) SARS-CoV-2 target nucleic acids are NOT DETECTED. The SARS-CoV-2 RNA is generally detectable in upper and lower respiratory specimens during the acute phase of infection. Negative results do not preclude SARS-CoV-2 infection, do not rule out co-infections with other pathogens, and should not be used as the sole basis for treatment or other patient management  decisions. Negative results must be combined with clinical observations, patient history, and epidemiological information. The expected result is Negative. Fact Sheet for Patients: SugarRoll.be Fact Sheet for Healthcare Providers: https://www.woods-mathews.com/ This test is not yet approved or cleared by the [place] FDA and  has been authorized for detection and/or diagnosis of SARS-CoV-2 by FDA under an Emergency Use Authorization (EUA). This EUA will remain  in effect (meaning this test can be used) for the duration  of the COVID-19 declaration under Section 56                          4(b)(1) of the Act, 21 U.S.C. section 360bbb-3(b)(1), unless the authorization is terminated or revoked sooner. Performed at [facility], [place] [address] [place], [place] [address]   Hospital Outpatient Visit on 04/19/2019  Component Date Value Ref Range Status  . aPTT 04/19/2019 32  24 - 36 seconds Final   Performed at [facility], [place] [address] [place], [place] [address]  . WBC 04/19/2019 6.4  4.0 - 10.5 K/uL Final  . RBC 04/19/2019 4.60  3.87 - 5.11 MIL/uL Final  . Hemoglobin 04/19/2019 11.6* 12.0 - 15.0 g/dL Final  . HCT 04/19/2019 37.3  36.0 - 46.0 % Final  . MCV 04/19/2019 81.1  80.0 - 100.0 fL Final  . MCH 04/19/2019 25.2* 26.0 - 34.0 pg Final  . MCHC 04/19/2019 31.1  30.0 - 36.0 g/dL Final  . RDW 04/19/2019 14.2  11.5 - 15.5 % Final  . Platelets 04/19/2019 232  150 - 400 K/uL Final  . nRBC 04/19/2019 0.0  0.0 - 0.2 % Final   Performed at [facility], [place] [address] [place], [place] [address]  . Sodium 04/19/2019 142  135 - 145 mmol/L Final  . Potassium 04/19/2019 3.9  3.5 - 5.1 mmol/L Final  . Chloride 04/19/2019 108  98 - 111 mmol/L Final  . CO2 04/19/2019 24  22 - 32 mmol/L Final  . Glucose, Bld 04/19/2019 97  70 - 99 mg/dL Final   Glucose reference range applies only to samples taken after fasting for at least 8 hours.  . BUN 04/19/2019 16  8 - 23 mg/dL Final  . Creatinine, Ser 04/19/2019 0.82  0.44 - 1.00 mg/dL Final  . Calcium 04/19/2019 8.9  8.9 - 10.3 mg/dL Final  . Total Protein 04/19/2019 6.9  6.5 - 8.1 g/dL Final  . Albumin 04/19/2019 4.6  3.5 - 5.0 g/dL Final  . AST 04/19/2019 21  15 - 41 U/L Final  . ALT 04/19/2019 18  0 - 44 U/L Final  . Alkaline Phosphatase 04/19/2019 82  38 - 126 U/L Final  . Total Bilirubin 04/19/2019 0.7  0.3 - 1.2 mg/dL Final  . GFR calc non Af Amer 04/19/2019 >60  >60 mL/min Final  . GFR calc Af Amer 04/19/2019 >60   >60 mL/min Final  . Anion gap 04/19/2019 10  5 - 15 Final   Performed at [facility], [place] [address] [place], [place] [address]  . Prothrombin Time 04/19/2019 12.7  11.4 - 15.2 seconds Final  . INR 04/19/2019 1.0  0.8 - 1.2 Final   Comment: (NOTE) INR goal varies based on device and disease states. Performed at [facility], [place] [address], [place], [place] [address]   . ABO/RH(D) 04/19/2019 O POS   Final  . Antibody Screen 04/19/2019 NEG   Final  .  Sample Expiration 04/19/2019 04/28/2019,2359   Final  . Extend sample reason 04/19/2019    Final                   Value:NO TRANSFUSIONS OR PREGNANCY IN THE PAST 3 MONTHS Performed at [facility], [place] [address], [place], [place] [address]   . MRSA, PCR 04/19/2019 NEGATIVE  NEGATIVE Final  . Staphylococcus aureus 04/19/2019 NEGATIVE  NEGATIVE Final   Comment: (NOTE) The Xpert SA Assay (FDA approved for NASAL specimens in patients 70 years of age and older), is one component of a comprehensive surveillance program. It is not intended to diagnose infection nor to guide or monitor treatment. Performed at [facility], [place] [address] [place], [place] [address]   . ABO/RH(D) 04/19/2019    Final                   Value:O POS Performed at [facility], [place] [address] [place], [place] [address]      X-Rays:No results found.  EKG:No orders found for this or any previous visit.   Hospital Course: Tina Clements is a 79 y.o. who was admitted to [facility]. They were brought to the operating room on 04/25/2019 and underwent Procedure(s): TOTAL KNEE ARTHROPLASTY.  Patient tolerated the procedure well and was later transferred to the recovery room and then to the orthopaedic floor for postoperative care. They were given PO and IV analgesics for pain control following their surgery. They were given 24 hours of postoperative antibiotics of   Anti-infectives (From admission, onward)   Start     Dose/Rate Route Frequency Ordered Stop   04/25/19 1800  ceFAZolin (ANCEF) IVPB 2g/100 mL premix     2 g 200 mL/hr over 30 Minutes Intravenous Every 6 hours 04/25/19 1425 04/26/19 0015   04/25/19 0900  ceFAZolin (ANCEF) IVPB 2g/100 mL premix     2 g 200 mL/hr over 30 Minutes Intravenous On call to O.R. 04/25/19 ID:[id number] 04/25/19 1205     and started on DVT prophylaxis in the form of Aspirin.   PT and OT were ordered for total joint protocol. Discharge planning consulted to help with postop disposition and equipment needs.  Patient had a good night on the evening of surgery. They started to get up OOB with therapy on POD #0. Pt was seen during rounds and was ready to go home pending progress with therapy. Hemovac drain was pulled without difficulty. She worked with therapy on POD #1 and was meeting her goals. Pt was discharged to home later that day in stable condition.  Diet: Regular diet Activity: WBAT Follow-up: in 2 weeks Disposition: Home with OPPT at [facility] Discharged Condition: stable   Discharge Instructions    Call MD / Call 911   Complete by: As directed    If you experience chest pain or shortness of breath, CALL 911 and be transported to the hospital emergency room.  If you develope a fever above 101 F, pus (white drainage) or increased drainage or redness at the wound, or calf pain, call your surgeon's office.   Change dressing   Complete by: As directed    You may remove the bulky bandage (ACE wrap and gauze) two days after surgery. You will have an adhesive waterproof bandage underneath. Leave this in place until your first follow-up appointment.   Constipation Prevention   Complete by: As directed    Drink plenty of fluids.  Prune juice  may be helpful.  You may use a stool softener, such as Colace (over the counter) 100 mg twice a day.  Use MiraLax (over the counter) for constipation as needed.   Diet - low sodium heart  healthy   Complete by: As directed    Do not put a pillow under the knee. Place it under the heel.   Complete by: As directed    Driving restrictions   Complete by: As directed    No driving for two weeks   TED hose   Complete by: As directed    Use stockings (TED hose) for three weeks on both leg(s).  You may remove them at night for sleeping.   Weight bearing as tolerated   Complete by: As directed      Allergies as of 04/26/2019      Reactions   Erythromycin    Headaches      Medication List    STOP taking these medications   aspirin 81 MG tablet Replaced by: aspirin 325 MG EC tablet   conjugated estrogens vaginal cream Commonly known as: Premarin     TAKE these medications   acetaminophen 325 MG tablet Commonly known as: TYLENOL Take 650 mg by mouth every 6 (six) hours as needed for moderate pain.   aspirin 325 MG EC tablet Take 1 tablet (325 mg total) by mouth 2 (two) times daily for 20 days. Then resume one 81 mg aspirin once a day. Replaces: aspirin 81 MG tablet   ergocalciferol 1.25 MG (50000 UT) capsule Commonly known as: VITAMIN D2 Take 50,000 Units by mouth once a week.   methocarbamol 500 MG tablet Commonly known as: ROBAXIN Take 1 tablet (500 mg total) by mouth every 6 (six) hours as needed for muscle spasms.   oxyCODONE 5 MG immediate release tablet Commonly known as: Oxy IR/ROXICODONE Take 1-2 tablets (5-10 mg total) by mouth every 6 (six) hours as needed for severe pain.   traMADol 50 MG tablet Commonly known as: ULTRAM Take 1-2 tablets (50-100 mg total) by mouth every 6 (six) hours as needed for moderate pain.            Discharge Care Instructions  (From admission, onward)         Start     Ordered   04/26/19 0000  Weight bearing as tolerated     04/26/19 0715   04/26/19 0000  Change dressing    Comments: You may remove the bulky bandage (ACE wrap and gauze) two days after surgery. You will have an adhesive waterproof bandage  underneath. Leave this in place until your first follow-up appointment.   04/26/19 0715         Follow-up Information    Gaynelle Arabian, MD. Go on 05/10/2019.   Specialty: Orthopedic Surgery Why: You are scheduled for a post-operative appointment on 05-10-19 at 3:00 pm.  Contact information: [address] [address] [phone]        Rosilyn Mings.. Go on 04/28/2019.   Why: You are scheduled for a physical therapy appointment on 04-28-19 at 11:00 am.  Contact information: [place] [id number] [phone]           Signed: Theresa Duty, PA-C Orthopedic Surgery 04/27/2019, 8:33 AM

## 2019-04-28 DIAGNOSIS — M25561 Pain in right knee: Secondary | ICD-10-CM | POA: Diagnosis not present

## 2019-05-03 DIAGNOSIS — M25561 Pain in right knee: Secondary | ICD-10-CM | POA: Diagnosis not present

## 2019-05-11 DIAGNOSIS — M25561 Pain in right knee: Secondary | ICD-10-CM | POA: Diagnosis not present

## 2019-05-13 DIAGNOSIS — M25561 Pain in right knee: Secondary | ICD-10-CM | POA: Diagnosis not present

## 2019-05-16 DIAGNOSIS — M25561 Pain in right knee: Secondary | ICD-10-CM | POA: Diagnosis not present

## 2019-05-18 DIAGNOSIS — M25561 Pain in right knee: Secondary | ICD-10-CM | POA: Diagnosis not present

## 2019-05-20 DIAGNOSIS — M25561 Pain in right knee: Secondary | ICD-10-CM | POA: Diagnosis not present

## 2019-05-26 DIAGNOSIS — M25561 Pain in right knee: Secondary | ICD-10-CM | POA: Diagnosis not present

## 2019-05-31 DIAGNOSIS — Z96651 Presence of right artificial knee joint: Secondary | ICD-10-CM | POA: Diagnosis not present

## 2019-05-31 DIAGNOSIS — M25561 Pain in right knee: Secondary | ICD-10-CM | POA: Diagnosis not present

## 2019-05-31 DIAGNOSIS — Z471 Aftercare following joint replacement surgery: Secondary | ICD-10-CM | POA: Diagnosis not present

## 2019-06-02 DIAGNOSIS — M25561 Pain in right knee: Secondary | ICD-10-CM | POA: Diagnosis not present

## 2019-06-08 DIAGNOSIS — M25561 Pain in right knee: Secondary | ICD-10-CM | POA: Diagnosis not present

## 2019-06-10 DIAGNOSIS — M25561 Pain in right knee: Secondary | ICD-10-CM | POA: Diagnosis not present

## 2019-06-14 DIAGNOSIS — M25561 Pain in right knee: Secondary | ICD-10-CM | POA: Diagnosis not present

## 2019-06-17 DIAGNOSIS — M25561 Pain in right knee: Secondary | ICD-10-CM | POA: Diagnosis not present

## 2019-06-21 DIAGNOSIS — M25561 Pain in right knee: Secondary | ICD-10-CM | POA: Diagnosis not present

## 2019-06-24 DIAGNOSIS — M25561 Pain in right knee: Secondary | ICD-10-CM | POA: Diagnosis not present

## 2019-06-28 DIAGNOSIS — M25561 Pain in right knee: Secondary | ICD-10-CM | POA: Diagnosis not present

## 2019-06-30 DIAGNOSIS — M25561 Pain in right knee: Secondary | ICD-10-CM | POA: Diagnosis not present

## 2019-07-05 DIAGNOSIS — M25561 Pain in right knee: Secondary | ICD-10-CM | POA: Diagnosis not present

## 2019-07-11 DIAGNOSIS — I10 Essential (primary) hypertension: Secondary | ICD-10-CM | POA: Diagnosis not present

## 2019-07-11 DIAGNOSIS — I119 Hypertensive heart disease without heart failure: Secondary | ICD-10-CM | POA: Diagnosis not present

## 2019-07-11 DIAGNOSIS — L299 Pruritus, unspecified: Secondary | ICD-10-CM | POA: Diagnosis not present

## 2019-07-11 DIAGNOSIS — Z79899 Other long term (current) drug therapy: Secondary | ICD-10-CM | POA: Diagnosis not present

## 2019-07-11 DIAGNOSIS — G933 Postviral fatigue syndrome: Secondary | ICD-10-CM | POA: Diagnosis not present

## 2019-07-11 DIAGNOSIS — E559 Vitamin D deficiency, unspecified: Secondary | ICD-10-CM | POA: Diagnosis not present

## 2019-07-11 DIAGNOSIS — M25561 Pain in right knee: Secondary | ICD-10-CM | POA: Diagnosis not present

## 2019-07-11 DIAGNOSIS — E7800 Pure hypercholesterolemia, unspecified: Secondary | ICD-10-CM | POA: Diagnosis not present

## 2019-07-11 DIAGNOSIS — R252 Cramp and spasm: Secondary | ICD-10-CM | POA: Diagnosis not present

## 2019-07-11 DIAGNOSIS — M2550 Pain in unspecified joint: Secondary | ICD-10-CM | POA: Diagnosis not present

## 2019-07-11 DIAGNOSIS — M159 Polyosteoarthritis, unspecified: Secondary | ICD-10-CM | POA: Diagnosis not present

## 2019-07-11 DIAGNOSIS — Z8249 Family history of ischemic heart disease and other diseases of the circulatory system: Secondary | ICD-10-CM | POA: Diagnosis not present

## 2019-07-11 DIAGNOSIS — D869 Sarcoidosis, unspecified: Secondary | ICD-10-CM | POA: Diagnosis not present

## 2019-10-31 DIAGNOSIS — Z1231 Encounter for screening mammogram for malignant neoplasm of breast: Secondary | ICD-10-CM | POA: Diagnosis not present

## 2019-11-15 DIAGNOSIS — R921 Mammographic calcification found on diagnostic imaging of breast: Secondary | ICD-10-CM | POA: Diagnosis not present

## 2019-11-21 ENCOUNTER — Other Ambulatory Visit: Payer: Self-pay

## 2019-11-21 ENCOUNTER — Ambulatory Visit (HOSPITAL_COMMUNITY)
Admission: RE | Admit: 2019-11-21 | Discharge: 2019-11-21 | Disposition: A | Discharge status: Home / Self Care | Payer: Medicare Other | Source: Ambulatory Visit | Attending: Surgery | Admitting: Surgery

## 2019-11-21 ENCOUNTER — Other Ambulatory Visit (HOSPITAL_COMMUNITY): Payer: Self-pay | Admitting: Pulmonary Disease

## 2019-11-21 DIAGNOSIS — R2689 Other abnormalities of gait and mobility: Secondary | ICD-10-CM | POA: Diagnosis not present

## 2019-11-21 DIAGNOSIS — I10 Essential (primary) hypertension: Secondary | ICD-10-CM | POA: Diagnosis not present

## 2019-11-21 DIAGNOSIS — Z79899 Other long term (current) drug therapy: Secondary | ICD-10-CM | POA: Diagnosis not present

## 2019-11-21 DIAGNOSIS — M159 Polyosteoarthritis, unspecified: Secondary | ICD-10-CM | POA: Diagnosis not present

## 2019-11-21 DIAGNOSIS — E7800 Pure hypercholesterolemia, unspecified: Secondary | ICD-10-CM | POA: Diagnosis not present

## 2019-11-21 DIAGNOSIS — M2550 Pain in unspecified joint: Secondary | ICD-10-CM | POA: Diagnosis not present

## 2019-11-21 DIAGNOSIS — E559 Vitamin D deficiency, unspecified: Secondary | ICD-10-CM | POA: Diagnosis not present

## 2019-11-21 DIAGNOSIS — R2681 Unsteadiness on feet: Secondary | ICD-10-CM

## 2019-11-21 DIAGNOSIS — R252 Cramp and spasm: Secondary | ICD-10-CM | POA: Diagnosis not present

## 2019-11-21 DIAGNOSIS — M25561 Pain in right knee: Secondary | ICD-10-CM | POA: Diagnosis not present

## 2019-11-21 DIAGNOSIS — G933 Postviral fatigue syndrome: Secondary | ICD-10-CM | POA: Diagnosis not present

## 2019-11-21 DIAGNOSIS — E669 Obesity, unspecified: Secondary | ICD-10-CM | POA: Diagnosis not present

## 2019-11-21 DIAGNOSIS — D869 Sarcoidosis, unspecified: Secondary | ICD-10-CM | POA: Diagnosis not present

## 2019-12-07 DIAGNOSIS — R921 Mammographic calcification found on diagnostic imaging of breast: Secondary | ICD-10-CM | POA: Diagnosis not present

## 2019-12-07 DIAGNOSIS — N6489 Other specified disorders of breast: Secondary | ICD-10-CM | POA: Diagnosis not present

## 2019-12-14 DIAGNOSIS — Z23 Encounter for immunization: Secondary | ICD-10-CM | POA: Diagnosis not present

## 2019-12-28 DIAGNOSIS — Z20822 Contact with and (suspected) exposure to covid-19: Secondary | ICD-10-CM | POA: Diagnosis not present

## 2020-01-03 DIAGNOSIS — Z20822 Contact with and (suspected) exposure to covid-19: Secondary | ICD-10-CM | POA: Diagnosis not present

## 2020-01-16 DIAGNOSIS — Z20822 Contact with and (suspected) exposure to covid-19: Secondary | ICD-10-CM | POA: Diagnosis not present

## 2020-01-30 DIAGNOSIS — Z20822 Contact with and (suspected) exposure to covid-19: Secondary | ICD-10-CM | POA: Diagnosis not present

## 2020-02-21 DIAGNOSIS — Z8249 Family history of ischemic heart disease and other diseases of the circulatory system: Secondary | ICD-10-CM | POA: Diagnosis not present

## 2020-02-21 DIAGNOSIS — I10 Essential (primary) hypertension: Secondary | ICD-10-CM | POA: Diagnosis not present

## 2020-02-21 DIAGNOSIS — R5383 Other fatigue: Secondary | ICD-10-CM | POA: Diagnosis not present

## 2020-02-21 DIAGNOSIS — Z7901 Long term (current) use of anticoagulants: Secondary | ICD-10-CM | POA: Diagnosis not present

## 2020-02-21 DIAGNOSIS — M2550 Pain in unspecified joint: Secondary | ICD-10-CM | POA: Diagnosis not present

## 2020-02-21 DIAGNOSIS — D869 Sarcoidosis, unspecified: Secondary | ICD-10-CM | POA: Diagnosis not present

## 2020-02-21 DIAGNOSIS — Z79899 Other long term (current) drug therapy: Secondary | ICD-10-CM | POA: Diagnosis not present

## 2020-02-21 DIAGNOSIS — Z96651 Presence of right artificial knee joint: Secondary | ICD-10-CM | POA: Diagnosis not present

## 2020-02-21 DIAGNOSIS — M1990 Unspecified osteoarthritis, unspecified site: Secondary | ICD-10-CM | POA: Diagnosis not present

## 2020-02-21 DIAGNOSIS — I119 Hypertensive heart disease without heart failure: Secondary | ICD-10-CM | POA: Diagnosis not present

## 2020-02-21 DIAGNOSIS — E559 Vitamin D deficiency, unspecified: Secondary | ICD-10-CM | POA: Diagnosis not present

## 2020-02-21 DIAGNOSIS — R2689 Other abnormalities of gait and mobility: Secondary | ICD-10-CM | POA: Diagnosis not present

## 2020-02-21 DIAGNOSIS — E7800 Pure hypercholesterolemia, unspecified: Secondary | ICD-10-CM | POA: Diagnosis not present

## 2020-02-21 DIAGNOSIS — L299 Pruritus, unspecified: Secondary | ICD-10-CM | POA: Diagnosis not present

## 2020-03-19 DIAGNOSIS — L304 Erythema intertrigo: Secondary | ICD-10-CM | POA: Diagnosis not present

## 2020-03-19 DIAGNOSIS — N6310 Unspecified lump in the right breast, unspecified quadrant: Secondary | ICD-10-CM | POA: Diagnosis not present

## 2020-03-19 DIAGNOSIS — Z01419 Encounter for gynecological examination (general) (routine) without abnormal findings: Secondary | ICD-10-CM | POA: Diagnosis not present

## 2020-04-18 DIAGNOSIS — R922 Inconclusive mammogram: Secondary | ICD-10-CM | POA: Diagnosis not present

## 2020-04-18 DIAGNOSIS — N6489 Other specified disorders of breast: Secondary | ICD-10-CM | POA: Diagnosis not present

## 2020-05-24 DIAGNOSIS — Z96651 Presence of right artificial knee joint: Secondary | ICD-10-CM | POA: Diagnosis not present

## 2020-06-12 DIAGNOSIS — G933 Postviral fatigue syndrome: Secondary | ICD-10-CM | POA: Diagnosis not present

## 2020-06-12 DIAGNOSIS — E559 Vitamin D deficiency, unspecified: Secondary | ICD-10-CM | POA: Diagnosis not present

## 2020-06-12 DIAGNOSIS — M2550 Pain in unspecified joint: Secondary | ICD-10-CM | POA: Diagnosis not present

## 2020-06-12 DIAGNOSIS — M159 Polyosteoarthritis, unspecified: Secondary | ICD-10-CM | POA: Diagnosis not present

## 2020-06-12 DIAGNOSIS — E7800 Pure hypercholesterolemia, unspecified: Secondary | ICD-10-CM | POA: Diagnosis not present

## 2020-06-12 DIAGNOSIS — D869 Sarcoidosis, unspecified: Secondary | ICD-10-CM | POA: Diagnosis not present

## 2020-06-12 DIAGNOSIS — I10 Essential (primary) hypertension: Secondary | ICD-10-CM | POA: Diagnosis not present

## 2020-06-12 DIAGNOSIS — Z8249 Family history of ischemic heart disease and other diseases of the circulatory system: Secondary | ICD-10-CM | POA: Diagnosis not present

## 2020-06-12 DIAGNOSIS — Z79899 Other long term (current) drug therapy: Secondary | ICD-10-CM | POA: Diagnosis not present

## 2020-06-12 DIAGNOSIS — L299 Pruritus, unspecified: Secondary | ICD-10-CM | POA: Diagnosis not present

## 2020-06-12 DIAGNOSIS — Z96651 Presence of right artificial knee joint: Secondary | ICD-10-CM | POA: Diagnosis not present

## 2020-06-12 DIAGNOSIS — R2689 Other abnormalities of gait and mobility: Secondary | ICD-10-CM | POA: Diagnosis not present

## 2020-06-28 DIAGNOSIS — L819 Disorder of pigmentation, unspecified: Secondary | ICD-10-CM | POA: Diagnosis not present

## 2020-06-28 DIAGNOSIS — L821 Other seborrheic keratosis: Secondary | ICD-10-CM | POA: Diagnosis not present

## 2020-06-28 DIAGNOSIS — B354 Tinea corporis: Secondary | ICD-10-CM | POA: Diagnosis not present

## 2020-07-27 DIAGNOSIS — B354 Tinea corporis: Secondary | ICD-10-CM | POA: Diagnosis not present

## 2020-11-05 DIAGNOSIS — Z1231 Encounter for screening mammogram for malignant neoplasm of breast: Secondary | ICD-10-CM | POA: Diagnosis not present

## 2020-11-20 DIAGNOSIS — Z8249 Family history of ischemic heart disease and other diseases of the circulatory system: Secondary | ICD-10-CM | POA: Diagnosis not present

## 2020-11-20 DIAGNOSIS — E559 Vitamin D deficiency, unspecified: Secondary | ICD-10-CM | POA: Diagnosis not present

## 2020-11-20 DIAGNOSIS — Z79899 Other long term (current) drug therapy: Secondary | ICD-10-CM | POA: Diagnosis not present

## 2020-11-20 DIAGNOSIS — E7800 Pure hypercholesterolemia, unspecified: Secondary | ICD-10-CM | POA: Diagnosis not present

## 2020-11-20 DIAGNOSIS — M1990 Unspecified osteoarthritis, unspecified site: Secondary | ICD-10-CM | POA: Diagnosis not present

## 2020-11-20 DIAGNOSIS — I119 Hypertensive heart disease without heart failure: Secondary | ICD-10-CM | POA: Diagnosis not present

## 2020-11-20 DIAGNOSIS — R2689 Other abnormalities of gait and mobility: Secondary | ICD-10-CM | POA: Diagnosis not present

## 2020-11-20 DIAGNOSIS — M2550 Pain in unspecified joint: Secondary | ICD-10-CM | POA: Diagnosis not present

## 2020-11-20 DIAGNOSIS — Z96651 Presence of right artificial knee joint: Secondary | ICD-10-CM | POA: Diagnosis not present

## 2020-11-20 DIAGNOSIS — L299 Pruritus, unspecified: Secondary | ICD-10-CM | POA: Diagnosis not present

## 2020-11-20 DIAGNOSIS — R5383 Other fatigue: Secondary | ICD-10-CM | POA: Diagnosis not present

## 2020-11-20 DIAGNOSIS — I10 Essential (primary) hypertension: Secondary | ICD-10-CM | POA: Diagnosis not present

## 2020-11-20 DIAGNOSIS — D869 Sarcoidosis, unspecified: Secondary | ICD-10-CM | POA: Diagnosis not present

## 2021-01-01 DIAGNOSIS — Z23 Encounter for immunization: Secondary | ICD-10-CM | POA: Diagnosis not present

## 2021-03-01 DIAGNOSIS — Z96651 Presence of right artificial knee joint: Secondary | ICD-10-CM | POA: Diagnosis not present

## 2021-03-01 DIAGNOSIS — M1712 Unilateral primary osteoarthritis, left knee: Secondary | ICD-10-CM | POA: Diagnosis not present

## 2021-03-21 DIAGNOSIS — M170 Bilateral primary osteoarthritis of knee: Secondary | ICD-10-CM | POA: Diagnosis not present

## 2021-03-21 DIAGNOSIS — M1712 Unilateral primary osteoarthritis, left knee: Secondary | ICD-10-CM | POA: Diagnosis not present

## 2021-03-25 DIAGNOSIS — Z01419 Encounter for gynecological examination (general) (routine) without abnormal findings: Secondary | ICD-10-CM | POA: Diagnosis not present

## 2021-03-25 DIAGNOSIS — M810 Age-related osteoporosis without current pathological fracture: Secondary | ICD-10-CM | POA: Diagnosis not present

## 2021-03-28 DIAGNOSIS — Z79899 Other long term (current) drug therapy: Secondary | ICD-10-CM | POA: Diagnosis not present

## 2021-03-28 DIAGNOSIS — I10 Essential (primary) hypertension: Secondary | ICD-10-CM | POA: Diagnosis not present

## 2021-03-28 DIAGNOSIS — Z8249 Family history of ischemic heart disease and other diseases of the circulatory system: Secondary | ICD-10-CM | POA: Diagnosis not present

## 2021-03-28 DIAGNOSIS — E7800 Pure hypercholesterolemia, unspecified: Secondary | ICD-10-CM | POA: Diagnosis not present

## 2021-03-28 DIAGNOSIS — E559 Vitamin D deficiency, unspecified: Secondary | ICD-10-CM | POA: Diagnosis not present

## 2021-03-28 DIAGNOSIS — M1990 Unspecified osteoarthritis, unspecified site: Secondary | ICD-10-CM | POA: Diagnosis not present

## 2021-03-28 DIAGNOSIS — R2689 Other abnormalities of gait and mobility: Secondary | ICD-10-CM | POA: Diagnosis not present

## 2021-03-28 DIAGNOSIS — Z96651 Presence of right artificial knee joint: Secondary | ICD-10-CM | POA: Diagnosis not present

## 2021-03-28 DIAGNOSIS — D869 Sarcoidosis, unspecified: Secondary | ICD-10-CM | POA: Diagnosis not present

## 2021-03-28 DIAGNOSIS — M2550 Pain in unspecified joint: Secondary | ICD-10-CM | POA: Diagnosis not present

## 2021-03-28 DIAGNOSIS — R5383 Other fatigue: Secondary | ICD-10-CM | POA: Diagnosis not present

## 2021-03-28 DIAGNOSIS — L299 Pruritus, unspecified: Secondary | ICD-10-CM | POA: Diagnosis not present

## 2021-04-11 DIAGNOSIS — M1712 Unilateral primary osteoarthritis, left knee: Secondary | ICD-10-CM | POA: Diagnosis not present

## 2021-04-18 DIAGNOSIS — M1712 Unilateral primary osteoarthritis, left knee: Secondary | ICD-10-CM | POA: Diagnosis not present

## 2021-04-24 DIAGNOSIS — M810 Age-related osteoporosis without current pathological fracture: Secondary | ICD-10-CM | POA: Diagnosis not present

## 2021-05-17 DIAGNOSIS — Z96651 Presence of right artificial knee joint: Secondary | ICD-10-CM | POA: Diagnosis not present

## 2021-05-17 DIAGNOSIS — M1712 Unilateral primary osteoarthritis, left knee: Secondary | ICD-10-CM | POA: Diagnosis not present

## 2021-06-06 ENCOUNTER — Other Ambulatory Visit: Payer: Self-pay

## 2021-06-06 ENCOUNTER — Emergency Department (HOSPITAL_COMMUNITY): Payer: Medicare Other

## 2021-06-06 ENCOUNTER — Inpatient Hospital Stay (HOSPITAL_COMMUNITY)
Admission: EM | Admit: 2021-06-06 | Discharge: 2021-06-10 | DRG: 522 | Disposition: A | Discharge status: Home Health | Payer: Medicare Other | Attending: Internal Medicine | Admitting: Internal Medicine

## 2021-06-06 ENCOUNTER — Encounter (HOSPITAL_COMMUNITY): Payer: Self-pay

## 2021-06-06 DIAGNOSIS — Z96651 Presence of right artificial knee joint: Secondary | ICD-10-CM | POA: Diagnosis present

## 2021-06-06 DIAGNOSIS — R9431 Abnormal electrocardiogram [ECG] [EKG]: Secondary | ICD-10-CM | POA: Diagnosis not present

## 2021-06-06 DIAGNOSIS — G8911 Acute pain due to trauma: Secondary | ICD-10-CM | POA: Diagnosis not present

## 2021-06-06 DIAGNOSIS — Z809 Family history of malignant neoplasm, unspecified: Secondary | ICD-10-CM | POA: Diagnosis not present

## 2021-06-06 DIAGNOSIS — D509 Iron deficiency anemia, unspecified: Secondary | ICD-10-CM | POA: Diagnosis not present

## 2021-06-06 DIAGNOSIS — Y92008 Other place in unspecified non-institutional (private) residence as the place of occurrence of the external cause: Secondary | ICD-10-CM

## 2021-06-06 DIAGNOSIS — E785 Hyperlipidemia, unspecified: Secondary | ICD-10-CM | POA: Diagnosis present

## 2021-06-06 DIAGNOSIS — M25552 Pain in left hip: Secondary | ICD-10-CM | POA: Diagnosis not present

## 2021-06-06 DIAGNOSIS — Z471 Aftercare following joint replacement surgery: Secondary | ICD-10-CM | POA: Diagnosis not present

## 2021-06-06 DIAGNOSIS — W1839XA Other fall on same level, initial encounter: Secondary | ICD-10-CM | POA: Diagnosis present

## 2021-06-06 DIAGNOSIS — Z8249 Family history of ischemic heart disease and other diseases of the circulatory system: Secondary | ICD-10-CM

## 2021-06-06 DIAGNOSIS — Q048 Other specified congenital malformations of brain: Secondary | ICD-10-CM | POA: Diagnosis not present

## 2021-06-06 DIAGNOSIS — W19XXXA Unspecified fall, initial encounter: Principal | ICD-10-CM

## 2021-06-06 DIAGNOSIS — M1712 Unilateral primary osteoarthritis, left knee: Secondary | ICD-10-CM | POA: Diagnosis not present

## 2021-06-06 DIAGNOSIS — R7989 Other specified abnormal findings of blood chemistry: Secondary | ICD-10-CM | POA: Diagnosis not present

## 2021-06-06 DIAGNOSIS — S72012A Unspecified intracapsular fracture of left femur, initial encounter for closed fracture: Secondary | ICD-10-CM | POA: Diagnosis present

## 2021-06-06 DIAGNOSIS — S72009A Fracture of unspecified part of neck of unspecified femur, initial encounter for closed fracture: Secondary | ICD-10-CM | POA: Diagnosis present

## 2021-06-06 DIAGNOSIS — I10 Essential (primary) hypertension: Secondary | ICD-10-CM | POA: Diagnosis not present

## 2021-06-06 DIAGNOSIS — S72002A Fracture of unspecified part of neck of left femur, initial encounter for closed fracture: Secondary | ICD-10-CM | POA: Diagnosis not present

## 2021-06-06 DIAGNOSIS — G911 Obstructive hydrocephalus: Secondary | ICD-10-CM

## 2021-06-06 DIAGNOSIS — R7401 Elevation of levels of liver transaminase levels: Secondary | ICD-10-CM | POA: Diagnosis not present

## 2021-06-06 DIAGNOSIS — D869 Sarcoidosis, unspecified: Secondary | ICD-10-CM | POA: Diagnosis present

## 2021-06-06 DIAGNOSIS — Z043 Encounter for examination and observation following other accident: Secondary | ICD-10-CM | POA: Diagnosis not present

## 2021-06-06 DIAGNOSIS — G9389 Other specified disorders of brain: Secondary | ICD-10-CM | POA: Diagnosis not present

## 2021-06-06 DIAGNOSIS — R030 Elevated blood-pressure reading, without diagnosis of hypertension: Secondary | ICD-10-CM

## 2021-06-06 DIAGNOSIS — Z96642 Presence of left artificial hip joint: Secondary | ICD-10-CM | POA: Diagnosis not present

## 2021-06-06 DIAGNOSIS — R55 Syncope and collapse: Secondary | ICD-10-CM

## 2021-06-06 DIAGNOSIS — Z90710 Acquired absence of both cervix and uterus: Secondary | ICD-10-CM

## 2021-06-06 DIAGNOSIS — R031 Nonspecific low blood-pressure reading: Secondary | ICD-10-CM | POA: Diagnosis not present

## 2021-06-06 DIAGNOSIS — M1990 Unspecified osteoarthritis, unspecified site: Secondary | ICD-10-CM | POA: Diagnosis not present

## 2021-06-06 LAB — CBC WITH DIFFERENTIAL/PLATELET
Abs Immature Granulocytes: 0.05 10*3/uL (ref 0.00–0.07)
Basophils Absolute: 0.1 10*3/uL (ref 0.0–0.1)
Basophils Relative: 1 %
Eosinophils Absolute: 0.9 10*3/uL — ABNORMAL HIGH (ref 0.0–0.5)
Eosinophils Relative: 10 %
HCT: 36.1 % (ref 36.0–46.0)
Hemoglobin: 11.5 g/dL — ABNORMAL LOW (ref 12.0–15.0)
Immature Granulocytes: 1 %
Lymphocytes Relative: 25 %
Lymphs Abs: 2.2 10*3/uL (ref 0.7–4.0)
MCH: 25.7 pg — ABNORMAL LOW (ref 26.0–34.0)
MCHC: 31.9 g/dL (ref 30.0–36.0)
MCV: 80.6 fL (ref 80.0–100.0)
Monocytes Absolute: 0.5 10*3/uL (ref 0.1–1.0)
Monocytes Relative: 5 %
Neutro Abs: 5.3 10*3/uL (ref 1.7–7.7)
Neutrophils Relative %: 58 %
Platelets: 180 10*3/uL (ref 150–400)
RBC: 4.48 MIL/uL (ref 3.87–5.11)
RDW: 14.3 % (ref 11.5–15.5)
WBC: 9 10*3/uL (ref 4.0–10.5)
nRBC: 0 % (ref 0.0–0.2)

## 2021-06-06 LAB — URINALYSIS, ROUTINE W REFLEX MICROSCOPIC
Bilirubin Urine: NEGATIVE
Glucose, UA: NEGATIVE mg/dL
Hgb urine dipstick: NEGATIVE
Ketones, ur: NEGATIVE mg/dL
Leukocytes,Ua: NEGATIVE
Nitrite: NEGATIVE
Protein, ur: NEGATIVE mg/dL
Specific Gravity, Urine: 1.024 (ref 1.005–1.030)
pH: 5 (ref 5.0–8.0)

## 2021-06-06 LAB — COMPREHENSIVE METABOLIC PANEL
ALT: 63 U/L — ABNORMAL HIGH (ref 0–44)
AST: 54 U/L — ABNORMAL HIGH (ref 15–41)
Albumin: 4.3 g/dL (ref 3.5–5.0)
Alkaline Phosphatase: 116 U/L (ref 38–126)
Anion gap: 8 (ref 5–15)
BUN: 15 mg/dL (ref 8–23)
CO2: 24 mmol/L (ref 22–32)
Calcium: 9.3 mg/dL (ref 8.9–10.3)
Chloride: 108 mmol/L (ref 98–111)
Creatinine, Ser: 0.87 mg/dL (ref 0.44–1.00)
GFR, Estimated: >60 mL/min (ref >60)
Glucose, Bld: 114 mg/dL — ABNORMAL HIGH (ref 70–99)
Potassium: 3.8 mmol/L (ref 3.5–5.1)
Sodium: 140 mmol/L (ref 135–145)
Total Bilirubin: 1.2 mg/dL (ref 0.3–1.2)
Total Protein: 7.2 g/dL (ref 6.5–8.1)

## 2021-06-06 LAB — CBG MONITORING, ED: Glucose-Capillary: 121 mg/dL — ABNORMAL HIGH (ref 70–99)

## 2021-06-06 MED ORDER — HYDROMORPHONE HCL 1 MG/ML IJ SOLN
0.5000 mg | Freq: Once | INTRAMUSCULAR | Status: DC
  Filled 2021-06-06: qty 0.5

## 2021-06-06 MED ORDER — ONDANSETRON HCL 4 MG/2ML IJ SOLN
4.0000 mg | Freq: Once | INTRAMUSCULAR | Status: DC
Start: 2021-06-06 — End: 2021-06-08

## 2021-06-06 NOTE — ED Provider Notes (Addendum)
?[facility] DEPT ?Provider Note ? ? ?CSN: [id number] ?Arrival date & time: 06/06/21  1753 ? ?  ? ?History ? ?Chief Complaint  ?Patient presents with  ? Fall  ? Loss of Consciousness  ? Groin Pain  ? Wrist Pain  ? ? ?Tina Clements is a 81 y.o. female. ? ?Pt s/p fall three days ago. Pt unsure what caused fall, was in hallway at home near kitchen. Husband heard her fall, went to her, she was conscious. Post fall, c/o left hip pain and mild soreness to left wrist. Denies headache. No neck or back pain. No chest pain or sob. No abd pain or nv. Denies other pain or injury. No anticoag use.  ? ?The history is provided by the patient, the spouse and medical records.  ?Fall ?Pertinent negatives include no chest pain, no abdominal pain, no headaches and no shortness of breath.  ?Loss of Consciousness ?Associated symptoms: no chest pain, no confusion, no fever, no headaches, no shortness of breath and no vomiting   ?Groin Pain ?Pertinent negatives include no chest pain, no abdominal pain, no headaches and no shortness of breath.  ?Wrist Pain ?Pertinent negatives include no chest pain, no abdominal pain, no headaches and no shortness of breath.  ? ?  ? ?Home Medications ?Prior to Admission medications   ?Medication Sig Start Date End Date Taking? Authorizing Provider  ?acetaminophen (TYLENOL) 325 MG tablet Take 650 mg by mouth every 6 (six) hours as needed for moderate pain.     [provider]  ?ergocalciferol (VITAMIN D2) 1.25 MG (50000 UT) capsule Take 50,000 Units by mouth once a week.    [provider]  ?methocarbamol (ROBAXIN) 500 MG tablet Take 1 tablet (500 mg total) by mouth every 6 (six) hours as needed for muscle spasms. 04/26/19   Edmisten, Ok Anis, PA  ?oxyCODONE (OXY IR/ROXICODONE) 5 MG immediate release tablet Take 1-2 tablets (5-10 mg total) by mouth every 6 (six) hours as needed for severe pain. 04/26/19   Edmisten, Ok Anis, PA  ?traMADol (ULTRAM) 50 MG tablet  Take 1-2 tablets (50-100 mg total) by mouth every 6 (six) hours as needed for moderate pain. 04/26/19   Edmisten, Ok Anis, PA  ?   ? ?Allergies    ?Erythromycin   ? ?Review of Systems   ?Review of Systems  ?Constitutional:  Negative for fever.  ?HENT:  Negative for nosebleeds.   ?Eyes:  Negative for pain.  ?Respiratory:  Negative for shortness of breath.   ?Cardiovascular:  Positive for syncope. Negative for chest pain.  ?Gastrointestinal:  Negative for abdominal pain and vomiting.  ?Genitourinary:  Negative for flank pain.  ?Musculoskeletal:  Negative for back pain and neck pain.  ?Skin:  Negative for rash.  ?Neurological:  Negative for headaches.  ?Hematological:  Does not bruise/bleed easily.  ?Psychiatric/Behavioral:  Negative for confusion.   ? ?Physical Exam ?Updated Vital Signs ?BP (!) 156/108 (BP Location: Left Arm)   Pulse 95   Temp 98.5 ?F (36.9 ?C) (Oral)   Resp 17   Ht 1.689 m (5' 6.5")   Wt 72.6 kg   LMP 05/21/1977   SpO2 90%   BMI 25.44 kg/m?  ?Physical Exam ?Vitals and nursing note reviewed.  ?Constitutional:   ?   Appearance: Normal appearance. She is well-developed.  ?HENT:  ?   Nose: Nose normal.  ?   Mouth/Throat:  ?   Mouth: Mucous membranes are moist.  ?Eyes:  ?   General: No  scleral icterus. ?   Conjunctiva/sclera: Conjunctivae normal.  ?   Pupils: Pupils are equal, round, and reactive to light.  ?Neck:  ?   Vascular: No carotid bruit.  ?   Trachea: No tracheal deviation.  ?Cardiovascular:  ?   Rate and Rhythm: Normal rate and regular rhythm.  ?   Pulses: Normal pulses.  ?   Heart sounds: Normal heart sounds. No murmur heard. ?  No friction rub. No gallop.  ?Pulmonary:  ?   Effort: Pulmonary effort is normal. No respiratory distress.  ?   Breath sounds: Normal breath sounds.  ?Chest:  ?   Chest wall: No tenderness.  ?Abdominal:  ?   General: Bowel sounds are normal. There is no distension.  ?   Palpations: Abdomen is soft.  ?   Tenderness: There is no abdominal tenderness.   ?Genitourinary: ?   Comments: No cva tenderness.  ?Musculoskeletal:     ?   General: No swelling.  ?   Cervical back: Normal range of motion and neck supple. No rigidity or tenderness. No muscular tenderness.  ?   Comments: CTLS spine, non tender, aligned, no step off. ?Tenderness/pain w rom left hip. Distal pulses palp bil. Mild tenderness left wrist, no focal scaphoid tenderness. No other focal pain or bony tenderness on extremity exam.   ?Skin: ?   General: Skin is warm and dry.  ?   Findings: No rash.  ?Neurological:  ?   Mental Status: She is alert.  ?   Comments: Alert, speech normal. GCS 15. Motor/sens grossly intact bil. LLE nvi. Gait not tested.   ?Psychiatric:     ?   Mood and Affect: Mood normal.  ? ? ?ED Results / Procedures / Treatments   ?Labs ?(all labs ordered are listed, but only abnormal results are displayed) ?Results for orders placed or performed during the hospital encounter of 06/06/21  ?Comprehensive metabolic panel  ?Result Value Ref Range  ? Sodium 140 135 - 145 mmol/L  ? Potassium 3.8 3.5 - 5.1 mmol/L  ? Chloride 108 98 - 111 mmol/L  ? CO2 24 22 - 32 mmol/L  ? Glucose, Bld 114 (H) 70 - 99 mg/dL  ? BUN 15 8 - 23 mg/dL  ? Creatinine, Ser 0.87 0.44 - 1.00 mg/dL  ? Calcium 9.3 8.9 - 10.3 mg/dL  ? Total Protein 7.2 6.5 - 8.1 g/dL  ? Albumin 4.3 3.5 - 5.0 g/dL  ? AST 54 (H) 15 - 41 U/L  ? ALT 63 (H) 0 - 44 U/L  ? Alkaline Phosphatase 116 38 - 126 U/L  ? Total Bilirubin 1.2 0.3 - 1.2 mg/dL  ? GFR, Estimated >60 >60 mL/min  ? Anion gap 8 5 - 15  ?CBC with Differential  ?Result Value Ref Range  ? WBC 9.0 4.0 - 10.5 K/uL  ? RBC 4.48 3.87 - 5.11 MIL/uL  ? Hemoglobin 11.5 (L) 12.0 - 15.0 g/dL  ? HCT 36.1 36.0 - 46.0 %  ? MCV 80.6 80.0 - 100.0 fL  ? MCH 25.7 (L) 26.0 - 34.0 pg  ? MCHC 31.9 30.0 - 36.0 g/dL  ? RDW 14.3 11.5 - 15.5 %  ? Platelets 180 150 - 400 K/uL  ? nRBC 0.0 0.0 - 0.2 %  ? Neutrophils Relative % 58 %  ? Neutro Abs 5.3 1.7 - 7.7 K/uL  ? Lymphocytes Relative 25 %  ? Lymphs Abs 2.2 0.7 -  4.0 K/uL  ? Monocytes Relative 5 %  ?  Monocytes Absolute 0.5 0.1 - 1.0 K/uL  ? Eosinophils Relative 10 %  ? Eosinophils Absolute 0.9 (H) 0.0 - 0.5 K/uL  ? Basophils Relative 1 %  ? Basophils Absolute 0.1 0.0 - 0.1 K/uL  ? Immature Granulocytes 1 %  ? Abs Immature Granulocytes 0.05 0.00 - 0.07 K/uL  ?Urinalysis, Routine w reflex microscopic Urine, Clean Catch  ?Result Value Ref Range  ? Color, Urine YELLOW YELLOW  ? APPearance CLEAR CLEAR  ? Specific Gravity, Urine 1.024 1.005 - 1.030  ? pH 5.0 5.0 - 8.0  ? Glucose, UA NEGATIVE NEGATIVE mg/dL  ? Hgb urine dipstick NEGATIVE NEGATIVE  ? Bilirubin Urine NEGATIVE NEGATIVE  ? Ketones, ur NEGATIVE NEGATIVE mg/dL  ? Protein, ur NEGATIVE NEGATIVE mg/dL  ? Nitrite NEGATIVE NEGATIVE  ? Leukocytes,Ua NEGATIVE NEGATIVE  ?CBG monitoring, ED  ?Result Value Ref Range  ? Glucose-Capillary 121 (H) 70 - 99 mg/dL  ? ?DG Wrist Complete Left ? ?Result Date: 06/06/2021 ?CLINICAL DATA:  fall EXAM: LEFT WRIST - COMPLETE 3+ VIEW COMPARISON:  Wrist radiographs from 03/11/2010. FINDINGS: There is no evidence of fracture or dislocation. There is no evidence of arthropathy or other focal bone abnormality. Soft tissues are unremarkable. IMPRESSION: Negative. Electronically Signed   By: Margaretha Sheffield M.D.   On: 06/06/2021 19:14  ? ?CT HEAD WO CONTRAST (5MM) ? ?Result Date: 06/06/2021 ?CLINICAL DATA:  Syncope/presyncope, cerebrovascular cause suspected EXAM: CT HEAD WITHOUT CONTRAST TECHNIQUE: Contiguous axial images were obtained from the base of the skull through the vertex without intravenous contrast. RADIATION DOSE REDUCTION: This exam was performed according to the departmental dose-optimization program which includes automated exposure control, adjustment of the mA and/or kV according to patient size and/or use of iterative reconstruction technique. COMPARISON:  None. FINDINGS: Brain: Ventriculomegaly, which is out of proportion to the degree of sulcal enlargement. There also is some  crowding of sulci at the vertex with mildly acute callosal angle. No evidence of acute large vascular territory infarct, acute hemorrhage, mass lesion, or midline shift. Vascular: No hyperdense vessel identified. Skull: No a

## 2021-06-06 NOTE — ED Provider Triage Note (Signed)
Emergency Medicine Provider Triage Evaluation Note ? ?Tina Clements , a 81 y.o. female  was evaluated in triage.  Pt complains of syncope.  Patient states that she syncopized Monday afternoon.  Does not remember the event.  Her husband found her on the floor and she is unsure of how long she lost consciousness.  States that she has had some pain in the left hip and left wrist since the fall but otherwise denies any complaints.  Denies any chest pain or shortness of breath before, during, or after the syncopal episode.  States that she began using a walker since the fall due to her left hip pain. ? ?Physical Exam  ?BP (!) 140/127 (BP Location: Left Arm)   Pulse 97   Temp 98.5 ?F (36.9 ?C) (Oral)   Resp 16   Ht 5' 6.5" (1.689 m)   Wt 72.6 kg   LMP 05/21/1977   SpO2 99%   BMI 25.44 kg/m?  ?Gen:   Awake, no distress   ?Resp:  Normal effort  ?MSK:   Moves extremities without difficulty  ?Other:   ? ?Medical Decision Making  ?Medically screening exam initiated at 6:40 PM.  Appropriate orders placed.  DANASHA MELMAN was informed that the remainder of the evaluation will be completed by another provider, this initial triage assessment does not replace that evaluation, and the importance of remaining in the ED until their evaluation is complete. ?  ?Rayna Sexton, PA-C ?06/06/21 1841 ? ?

## 2021-06-06 NOTE — ED Triage Notes (Signed)
Patient states she fell due to having LOC  3 days ago. Patient c/o left groin and left wrist pain. ? ?Patient denies taking blood thinners. Patient states she had bit the inside of her lip and states it is still sore. ?

## 2021-06-07 ENCOUNTER — Inpatient Hospital Stay (HOSPITAL_COMMUNITY): Payer: Medicare Other

## 2021-06-07 ENCOUNTER — Inpatient Hospital Stay (HOSPITAL_COMMUNITY): Payer: Medicare Other | Admitting: Anesthesiology

## 2021-06-07 DIAGNOSIS — R7989 Other specified abnormal findings of blood chemistry: Secondary | ICD-10-CM

## 2021-06-07 DIAGNOSIS — R55 Syncope and collapse: Secondary | ICD-10-CM | POA: Diagnosis not present

## 2021-06-07 DIAGNOSIS — G9389 Other specified disorders of brain: Secondary | ICD-10-CM

## 2021-06-07 DIAGNOSIS — S72002A Fracture of unspecified part of neck of left femur, initial encounter for closed fracture: Secondary | ICD-10-CM | POA: Diagnosis not present

## 2021-06-07 DIAGNOSIS — I10 Essential (primary) hypertension: Secondary | ICD-10-CM | POA: Insufficient documentation

## 2021-06-07 LAB — COMPREHENSIVE METABOLIC PANEL
ALT: 49 U/L — ABNORMAL HIGH (ref 0–44)
AST: 34 U/L (ref 15–41)
Albumin: 3.6 g/dL (ref 3.5–5.0)
Alkaline Phosphatase: 100 U/L (ref 38–126)
Anion gap: 7 (ref 5–15)
BUN: 13 mg/dL (ref 8–23)
CO2: 25 mmol/L (ref 22–32)
Calcium: 9 mg/dL (ref 8.9–10.3)
Chloride: 109 mmol/L (ref 98–111)
Creatinine, Ser: 0.78 mg/dL (ref 0.44–1.00)
GFR, Estimated: >60 mL/min (ref >60)
Glucose, Bld: 112 mg/dL — ABNORMAL HIGH (ref 70–99)
Potassium: 3.5 mmol/L (ref 3.5–5.1)
Sodium: 141 mmol/L (ref 135–145)
Total Bilirubin: 1.4 mg/dL — ABNORMAL HIGH (ref 0.3–1.2)
Total Protein: 6.1 g/dL — ABNORMAL LOW (ref 6.5–8.1)

## 2021-06-07 LAB — CBC
HCT: 31.5 % — ABNORMAL LOW (ref 36.0–46.0)
Hemoglobin: 10.3 g/dL — ABNORMAL LOW (ref 12.0–15.0)
MCH: 25.8 pg — ABNORMAL LOW (ref 26.0–34.0)
MCHC: 32.7 g/dL (ref 30.0–36.0)
MCV: 78.9 fL — ABNORMAL LOW (ref 80.0–100.0)
Platelets: 165 10*3/uL (ref 150–400)
RBC: 3.99 MIL/uL (ref 3.87–5.11)
RDW: 14 % (ref 11.5–15.5)
WBC: 7 10*3/uL (ref 4.0–10.5)
nRBC: 0 % (ref 0.0–0.2)

## 2021-06-07 LAB — ECHOCARDIOGRAM COMPLETE
AR max vel: 2.17 cm2
AV Peak grad: 17.9 mmHg
Ao pk vel: 2.12 m/s
Area-P 1/2: 4.57 cm2
Height: 66.5 in
MV M vel: 5.49 m/s
MV Peak grad: 120.6 mmHg
P 1/2 time: 625 msec
S' Lateral: 2.6 cm
Weight: 2560 oz

## 2021-06-07 LAB — TYPE AND SCREEN
ABO/RH(D): O POS
Antibody Screen: NEGATIVE

## 2021-06-07 MED ORDER — BUPIVACAINE-EPINEPHRINE (PF) 0.5% -1:200000 IJ SOLN
INTRAMUSCULAR | Status: DC | PRN
  Administered 2021-06-07: 30 mL via PERINEURAL

## 2021-06-07 MED ORDER — FENTANYL CITRATE PF 50 MCG/ML IJ SOSY: 50.0000 ug | PREFILLED_SYRINGE | INTRAMUSCULAR | Status: DC

## 2021-06-07 MED ORDER — POVIDONE-IODINE 10 % EX SWAB: 2.0000 "application " | Freq: Once | CUTANEOUS | Status: DC

## 2021-06-07 MED ORDER — CEFAZOLIN SODIUM-DEXTROSE 2-4 GM/100ML-% IV SOLN
2.0000 g | INTRAVENOUS | Status: AC
  Administered 2021-06-08: 2 g via INTRAVENOUS
  Filled 2021-06-07: qty 100

## 2021-06-07 MED ORDER — SODIUM CHLORIDE 0.9 % IV SOLN: INTRAVENOUS | Status: DC

## 2021-06-07 MED ORDER — ONDANSETRON HCL 4 MG/2ML IJ SOLN: 4.0000 mg | Freq: Four times a day (QID) | INTRAMUSCULAR | Status: DC | PRN

## 2021-06-07 MED ORDER — CEFAZOLIN SODIUM-DEXTROSE 2-4 GM/100ML-% IV SOLN: 2.0000 g | INTRAVENOUS | Status: DC

## 2021-06-07 MED ORDER — MORPHINE SULFATE (PF) 2 MG/ML IV SOLN
1.0000 mg | INTRAVENOUS | Status: DC | PRN
  Administered 2021-06-07 – 2021-06-10 (×6): 1 mg via INTRAVENOUS
  Filled 2021-06-07 (×6): qty 1

## 2021-06-07 MED ORDER — ACETAMINOPHEN 500 MG PO TABS
1000.0000 mg | ORAL_TABLET | Freq: Once | ORAL | Status: AC
  Administered 2021-06-08: 1000 mg via ORAL
  Filled 2021-06-07: qty 2

## 2021-06-07 MED ORDER — FENTANYL CITRATE PF 50 MCG/ML IJ SOSY
PREFILLED_SYRINGE | INTRAMUSCULAR | Status: AC
  Administered 2021-06-07: 50 ug
  Filled 2021-06-07: qty 1

## 2021-06-07 MED ORDER — SODIUM CHLORIDE 0.9% FLUSH: 3.0000 mL | Freq: Two times a day (BID) | INTRAVENOUS | Status: DC

## 2021-06-07 MED ORDER — HYDROCODONE-ACETAMINOPHEN 5-325 MG PO TABS
1.0000 | ORAL_TABLET | ORAL | Status: DC | PRN
  Administered 2021-06-09 (×2): 1 via ORAL
  Administered 2021-06-09: 2 via ORAL
  Administered 2021-06-09 – 2021-06-10 (×2): 1 via ORAL
  Filled 2021-06-07: qty 1
  Filled 2021-06-07: qty 2
  Filled 2021-06-07 (×3): qty 1

## 2021-06-07 MED ORDER — DEXAMETHASONE SODIUM PHOSPHATE 4 MG/ML IJ SOLN
INTRAMUSCULAR | Status: DC | PRN
  Administered 2021-06-07: 5 mg via PERINEURAL

## 2021-06-07 MED ORDER — CHLORHEXIDINE GLUCONATE 4 % EX LIQD
60.0000 mL | Freq: Once | CUTANEOUS | Status: AC
  Administered 2021-06-08: 4 via TOPICAL
  Filled 2021-06-07: qty 60

## 2021-06-07 MED ORDER — TRANEXAMIC ACID-NACL 1000-0.7 MG/100ML-% IV SOLN
1000.0000 mg | INTRAVENOUS | Status: AC
  Administered 2021-06-08: 1000 mg via INTRAVENOUS
  Filled 2021-06-07: qty 100

## 2021-06-07 NOTE — Assessment & Plan Note (Addendum)
Impacted left subcapital femoral neck fracture following syncope with loss of consciousness and fall ?[facility] will take to the OR in the morning.  N.p.o. past midnight. ?PRN hydrocodone and IV morphine for pain ?

## 2021-06-07 NOTE — Assessment & Plan Note (Signed)
Unclear etiology.  Reports chronic issues with dizziness.  Presented with normal blood pressure no electrolyte abnormalities. ?-Keep on continuous telemetry and obtain echocardiogram ?-Orthostatic vital signs ?

## 2021-06-07 NOTE — H&P (Signed)
?History and Physical  ? ? ?Patient: Tina Clements ZHG:[id number] DOB: 12/07/1940 ?DOA: 06/06/2021 ?DOS: the patient was seen and examined on 06/07/2021 ?PCP: [name], MD  ?Patient coming from: Home ? ?Chief Complaint:  ?Chief Complaint  ?Patient presents with  ? Fall  ? Loss of Consciousness  ? Groin Pain  ? Wrist Pain  ? ?HPI: Tina Clements is a 81 y.o. female with medical history significant of  hyperlipidemia,OA, and osteopenia who presents following syncope with loss of consciousness and a fall. ? ?Patient had a fall 3 days ago.  She remembers being in her kitchen looking into the fridge but woke up in the hallway. Unclear how long she was down until husband found her on the ground. Cannot recall if she had dizziness or headache prior but reports she has been dealing with dizziness at least once a week for a few years.  Denies any chest pain or palpitation.  She normally ambulates with a walker but has not been able to walk in the past several days due to pain to her left thigh.  No anticoagulation or use of daily aspirin. ? ?In the ED, she was afebrile mildly hypertensive with SBP of 150.  No leukocytosis, hemoglobin of 11.5.  CMP with mildly elevated AST of 54, ALT of 63. ?Hip x-ray shows impacted left subcapital femoral neck fracture.  ED physician consulted [facility] who will see in consultation tomorrow and take to the OR. ? ?CT head showed ventriculomegaly with some findings of possible NPH.' ? ?Hospitalist consulted for further work-up and management. ? ?Review of Systems: As mentioned in the history of present illness. All other systems reviewed and are negative. ?Past Medical History:  ?Diagnosis Date  ? Arthritis   ? Dyspnea   ? with excertion  ? Dysrhythmia 2000  ? paplatations and fast  ? Hyperlipidemia   ? Sarcoidosis   ? ?Past Surgical History:  ?Procedure Laterality Date  ? ABDOMINAL HYSTERECTOMY    ? KNEE ARTHROSCOPY    ? RADICAL HYSTERECTOMY WITH TRANSPOSITION OF OVARIES    ?  SHOULDER ARTHROSCOPY    ? TONSILLECTOMY    ? TOTAL KNEE ARTHROPLASTY Right 04/25/2019  ? Procedure: TOTAL KNEE ARTHROPLASTY;  Surgeon: Gaynelle Arabian, MD;  Location: [facility];  Service: Orthopedics;  Laterality: Right;  33mn  ? ?Social History:  reports that she has never smoked. She has never used smokeless tobacco. She reports current alcohol use. She reports that she does not use drugs. ? ?Allergies  ?Allergen Reactions  ? Erythromycin   ?  Headaches  ? ? ?Family History  ?Problem Relation Age of Onset  ? Heart disease Mother   ? Cancer Father   ? Hepatitis C Sister   ? Sarcoidosis Sister   ? ? ?Prior to Admission medications   ?Medication Sig Start Date End Date Taking? Authorizing Provider  ?acetaminophen (TYLENOL) 325 MG tablet Take 650 mg by mouth every 6 (six) hours as needed for moderate pain.     [provider]  ?ergocalciferol (VITAMIN D2) 1.25 MG (50000 UT) capsule Take 50,000 Units by mouth once a week.    [provider]  ?methocarbamol (ROBAXIN) 500 MG tablet Take 1 tablet (500 mg total) by mouth every 6 (six) hours as needed for muscle spasms. 04/26/19   Edmisten, KOk Anis PA  ?oxyCODONE (OXY IR/ROXICODONE) 5 MG immediate release tablet Take 1-2 tablets (5-10 mg total) by mouth every 6 (six) hours as needed for severe pain. 04/26/19   Edmisten,  Kristie L, PA  ?traMADol (ULTRAM) 50 MG tablet Take 1-2 tablets (50-100 mg total) by mouth every 6 (six) hours as needed for moderate pain. 04/26/19   Edmisten, Ok Anis, PA  ? ? ?Physical Exam: ?Vitals:  ? 06/06/21 2049 06/06/21 2230 06/06/21 2300 06/07/21 0000  ?BP: (!) 156/108 (!) 154/88 (!) 164/80 (!) 163/93  ?Pulse: 95 88 80 85  ?Resp: '17 17 16 16  '$ ?Temp:    99.5 ?F (37.5 ?C)  ?TempSrc:    Oral  ?SpO2: 90% 98% 99% 98%  ?Weight:      ?Height:      ? ?Constitutional: NAD, calm, comfortable, elderly female appearing much younger than stated age laying flat in bed ?Eyes: lids and conjunctivae normal ?ENMT: Mucous membranes are moist.  ?Neck:  normal, supple ?Respiratory: clear to auscultation bilaterally, no wheezing, no crackles. Normal respiratory effort. No accessory muscle use.  ?Cardiovascular: Regular rate and rhythm, no murmurs / rubs / gallops. No extremity edema. 2+ pedal pulses. ?Abdomen: no tenderness, Bowel sounds positive.  ?Musculoskeletal: no clubbing / cyanosis. No joint deformity upper and lower extremities. No leg shortening. Slight external rotation of left LE.  ?Skin: no rashes, lesions, ulcers. No induration ?Neurologic: CN 2-12 grossly intact. Sensation intact,Strength 5/5 in all 4.  ?Psychiatric: Normal judgment and insight. Alert and oriented x 3. Normal mood. ?Data Reviewed: ? ?See HPI ? ?Assessment and Plan: ?* Hip fracture ([place]) ?Impacted left subcapital femoral neck fracture following syncope with loss of consciousness and fall ?[facility] will take to the OR in the morning.  N.p.o. past midnight. ?PRN hydrocodone and IV morphine for pain ? ?Syncope and collapse ?Unclear etiology.  Reports chronic issues with dizziness.  Presented with normal blood pressure no electrolyte abnormalities. ?-Keep on continuous telemetry and obtain echocardiogram ?-Orthostatic vital signs ? ?Cerebral ventriculomegaly ?Suspect likely chronic. Baseline she ambulates with walker, no cognitive changes.  ? ?Abnormal LFTs ?Mildly elevated AST 54, ALT of 63.  Unclear etiology.  Monitor repeat CMP in the morning ? ? ? ? ? Advance Care Planning:   Code Status: Full Code  ? ?Consults: [facility] ? ?Family Communication: No family at bedside ? ?Severity of Illness: ?The appropriate patient status for this patient is INPATIENT. Inpatient status is judged to be reasonable and necessary in order to provide the required intensity of service to ensure the patient's safety. The patient's presenting symptoms, physical exam findings, and initial radiographic and laboratory data in the context of their chronic comorbidities is felt to place them at high risk for  further clinical deterioration. Furthermore, it is not anticipated that the patient will be medically stable for discharge from the hospital within 2 midnights of admission.  ? ?* I certify that at the point of admission it is my clinical judgment that the patient will require inpatient hospital care spanning beyond 2 midnights from the point of admission due to high intensity of service, high risk for further deterioration and high frequency of surveillance required.* ? ?Author: Orene Desanctis, DO ?06/07/2021 12:39 AM ? ?For on call review [email or web address].  ?

## 2021-06-07 NOTE — Progress Notes (Signed)
?PROGRESS NOTE ? ? ? ?Tina Clements  DSK:[id number] DOB: 28-Sep-1940 DOA: 06/06/2021 ?PCP: [name], MD  ? ?Brief Narrative:  ? 81 y.o. female with medical history significant of  hyperlipidemia,OA, and osteopenia presented with syncope with loss of consciousness and fall.  On presentation, AST was 54, ALT of 63.  Hip x-ray showed impacted left subcapital femoral neck fracture.  Orthopedics was consulted.  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 ? ?Assessment & Plan: ?  ?Left subcapital femoral neck fracture following a fall ?-Orthopedic surgery planning for possible surgery tomorrow.  Will keep him n.p.o. past midnight for tomorrow. ?-Pain management.  Fall precautions ? ?Syncope and collapse ?-Unclear etiology.  Continue telemetry monitoring.  Follow echocardiogram.  ?-PT eval after surgery ? ?Cerebral ventriculomegaly ?-Most likely chronic. ?-Baseline she ambulates with walker, no cognitive changes. ?-Will benefit from outpatient neurology evaluation ? ?Abnormal LFTs ?-Questionable cause.  Improving ? ?Microcytic anemia ?-Questionable cause.  Hemoglobin stable. ? ? ?DVT prophylaxis: Hold Lovenox for surgical intervention ?Code Status: Full ?Family Communication: Husband at bedside ?Disposition Plan: ?Status is: Inpatient ?Remains inpatient appropriate because: Of severity of illness ? ? ? ?Consultants: Orthopedics ? ?Procedures: None ? ?Antimicrobials: None ? ? ?Subjective: ?Patient seen and examined at bedside.  Complains of intermittent left hip pain.  No overnight fever, vomiting, chest pain reported. ? ?Objective: ?Vitals:  ? 06/07/21 0300 06/07/21 0400 06/07/21 0500 06/07/21 0600  ?BP: 131/71 (!) 141/76 134/69 (!) 150/85  ?Pulse: 82 71 72 73  ?Resp: '17 16 16 17  '$ ?Temp:      ?TempSrc:      ?SpO2: 92% 95% 93% 98%  ?Weight:      ?Height:      ? ?No intake or output data in the 24 hours ending 06/07/21 0818 ?Filed Weights  ? 06/06/21 1823  ?Weight: 72.6 kg   ? ? ?Examination: ? ?General exam: Appears calm and comfortable.  Currently on room air. ?Respiratory system: Bilateral decreased breath sounds at bases ?Cardiovascular system: S1 & S2 heard, Rate controlled ?Gastrointestinal system: Abdomen is nondistended, soft and nontender. Normal bowel sounds heard. ?Extremities: No cyanosis, clubbing, edema  ?Central nervous system: Alert and oriented. No focal neurological deficits. Moving extremities ?Skin: No rashes, lesions or ulcers ?Psychiatry: Judgement and insight appear normal. Mood & affect appropriate.  ? ? ? ?Data Reviewed: I have personally reviewed following labs and imaging studies ? ?CBC: ?Recent Labs  ?Lab 06/06/21 ?2053 06/07/21 ?6203  ?WBC 9.0 7.0  ?NEUTROABS 5.3  --   ?HGB 11.5* 10.3*  ?HCT 36.1 31.5*  ?MCV 80.6 78.9*  ?PLT 180 165  ? ?Basic Metabolic Panel: ?Recent Labs  ?Lab 06/06/21 ?2053 06/07/21 ?5597  ?NA 140 141  ?K 3.8 3.5  ?CL 108 109  ?CO2 24 25  ?GLUCOSE 114* 112*  ?BUN 15 13  ?CREATININE 0.87 0.78  ?CALCIUM 9.3 9.0  ? ?GFR: ?Estimated Creatinine Clearance: 56.9 mL/min (by C-G formula based on SCr of 0.78 mg/dL). ?Liver Function Tests: ?Recent Labs  ?Lab 06/06/21 ?2053 06/07/21 ?4163  ?AST 54* 34  ?ALT 63* 49*  ?ALKPHOS 116 100  ?BILITOT 1.2 1.4*  ?PROT 7.2 6.1*  ?ALBUMIN 4.3 3.6  ? ?No results for input(s): LIPASE, AMYLASE in the last 168 hours. ?No results for input(s): AMMONIA in the last 168 hours. ?Coagulation Profile: ?No results for input(s): INR, PROTIME in the last 168 hours. ?Cardiac Enzymes: ?No results for input(s): CKTOTAL, CKMB, CKMBINDEX, TROPONINI in the last 168 hours. ?BNP (  last 3 results) ?No results for input(s): PROBNP in the last 8760 hours. ?HbA1C: ?No results for input(s): HGBA1C in the last 72 hours. ?CBG: ?Recent Labs  ?Lab 06/06/21 ?1814  ?GLUCAP 121*  ? ?Lipid Profile: ?No results for input(s): CHOL, HDL, LDLCALC, TRIG, CHOLHDL, LDLDIRECT in the last 72 hours. ?Thyroid Function Tests: ?No results for input(s): TSH,  T4TOTAL, FREET4, T3FREE, THYROIDAB in the last 72 hours. ?Anemia Panel: ?No results for input(s): VITAMINB12, FOLATE, FERRITIN, TIBC, IRON, RETICCTPCT in the last 72 hours. ?Sepsis Labs: ?No results for input(s): PROCALCITON, LATICACIDVEN in the last 168 hours. ? ?No results found for this or any previous visit (from the past 240 hour(s)).  ? ? ? ? ? ?Radiology Studies: ?DG Wrist Complete Left ? ?Result Date: 06/06/2021 ?CLINICAL DATA:  fall EXAM: LEFT WRIST - COMPLETE 3+ VIEW COMPARISON:  Wrist radiographs from 03/11/2010. FINDINGS: There is no evidence of fracture or dislocation. There is no evidence of arthropathy or other focal bone abnormality. Soft tissues are unremarkable. IMPRESSION: Negative. Electronically Signed   By: Margaretha Sheffield M.D.   On: 06/06/2021 19:14  ? ?CT HEAD WO CONTRAST (5MM) ? ?Result Date: 06/06/2021 ?CLINICAL DATA:  Syncope/presyncope, cerebrovascular cause suspected EXAM: CT HEAD WITHOUT CONTRAST TECHNIQUE: Contiguous axial images were obtained from the base of the skull through the vertex without intravenous contrast. RADIATION DOSE REDUCTION: This exam was performed according to the departmental dose-optimization program which includes automated exposure control, adjustment of the mA and/or kV according to patient size and/or use of iterative reconstruction technique. COMPARISON:  None. FINDINGS: Brain: Ventriculomegaly, which is out of proportion to the degree of sulcal enlargement. There also is some crowding of sulci at the vertex with mildly acute callosal angle. No evidence of acute large vascular territory infarct, acute hemorrhage, mass lesion, or midline shift. Vascular: No hyperdense vessel identified. Skull: No acute fracture. Sinuses/Orbits: Clear sinuses.  No acute orbital findings. Other: No mastoid effusions. IMPRESSION: Ventriculomegaly with some findings that can be seen with normal pressure hydrocephalus (NPH). Central predominant atrophy is a differential  consideration. Recommend correlation with the presence or absence of signs/symptoms of NPH. Electronically Signed   By: Margaretha Sheffield M.D.   On: 06/06/2021 19:19  ? ?DG Hip Unilat W or Wo Pelvis 2-3 Views Left ? ?Result Date: 06/06/2021 ?CLINICAL DATA:  Fall, left hip pain EXAM: DG HIP (WITH OR WITHOUT PELVIS) 2-3V LEFT COMPARISON:  None. FINDINGS: Single view radiograph pelvis and two view radiograph left hip demonstrates an acute, mildly impacted, anatomically aligned subcapital left femoral neck fracture. Pelvis and visualized right hip is intact. Joint spaces are preserved. Soft tissues are unremarkable. IMPRESSION: Impacted, anatomically aligned left subcapital femoral neck fracture. Electronically Signed   By: Fidela Salisbury M.D.   On: 06/06/2021 19:19   ? ? ? ? ? ?Scheduled Meds: ?  HYDROmorphone (DILAUDID) injection  0.5 mg Intravenous Once  ? ondansetron (ZOFRAN) IV  4 mg Intravenous Once  ? sodium chloride flush  3 mL Intravenous Q12H  ? ?Continuous Infusions: ? ? ? ? ? ? ? ?Aline August, MD ?[facility] ?06/07/2021, 8:18 AM  ? ?

## 2021-06-07 NOTE — Progress Notes (Signed)
Assisted Dr. Brock with left, femoral, ultrasound guided block. Side rails up, monitors on throughout procedure. See vital signs in flow sheet. Tolerated Procedure well. 

## 2021-06-07 NOTE — Assessment & Plan Note (Addendum)
Mildly elevated AST 54, ALT of 63.  Unclear etiology.  Monitor repeat CMP in the morning ?

## 2021-06-07 NOTE — Addendum Note (Signed)
Addendum  created 06/07/21 1201 by Abelino Derrick, RN  ? Charge Capture section accepted  ?  ?

## 2021-06-07 NOTE — Anesthesia Preprocedure Evaluation (Addendum)
Anesthesia Evaluation  ?Patient identified by MRN, date of birth, ID band ?Patient awake ? ? ? ?Reviewed: ?Allergy & Precautions, NPO status , Patient's Chart, lab work & pertinent test results ? ?Airway ?Mallampati: II ? ?TM Distance: >3 FB ?Neck ROM: Full ? ? ? Dental ? ?(+) Dental Advisory Given, Teeth Intact ?  ?Pulmonary ?shortness of breath,  ?  ?Pulmonary exam normal ?breath sounds clear to auscultation ? ? ? ? ? ? Cardiovascular ?hypertension, Pt. on medications ?Normal cardiovascular exam+ dysrhythmias  ?Rhythm:Regular Rate:Normal ? ?Echo 05/2021 ??1. Left ventricular ejection fraction, by estimation, is 60 to 65%. The left ventricle has normal function. The left ventricle has no regional wall motion abnormalities. Left ventricular diastolic parameters are consistent with Grade I diastolic dysfunction (impaired relaxation).  ??2. Right ventricular systolic function is normal. The right ventricular size is normal. There is moderately elevated pulmonary artery systolic pressure.  ??3. Left atrial size was mildly dilated.  ??4. The mitral valve is normal in structure. Mild mitral valve  ?regurgitation. No evidence of mitral stenosis.  ??5. The aortic valve is tricuspid. Aortic valve regurgitation is trivial. Aortic valve sclerosis is present, with no evidence of aortic valve stenosis.  ??6. The inferior vena cava is normal in size with greater than 50% respiratory variability, suggesting right atrial pressure of 3 mmHg.  ?  ?Neuro/Psych ?negative neurological ROS ?   ? GI/Hepatic ?negative GI ROS, Neg liver ROS,   ?Endo/Other  ?negative endocrine ROS ? Renal/GU ?negative Renal ROS  ? ?  ?Musculoskeletal ? ?(+) Arthritis ,  ? Abdominal ?  ?Peds ? Hematology ?negative hematology ROS ?(+)   ?Anesthesia Other Findings ? ? Reproductive/Obstetrics ? ?  ? ? ? ? ? ? ? ? ? ? ? ? ? ?  ?  ? ? ? ? ? ? ? ?Anesthesia Physical ?Anesthesia Plan ? ?ASA: 3 ? ?Anesthesia Plan: Spinal  ? ?Post-op  Pain Management: Tylenol PO (pre-op)* and Regional block*  ? ?Induction: Intravenous ? ?PONV Risk Score and Plan: 3 and Ondansetron, Treatment may vary due to age or medical condition, Dexamethasone and Propofol infusion ? ?Airway Management Planned: Natural Airway ? ?Additional Equipment:  ? ?Intra-op Plan:  ? ?Post-operative Plan:  ? ?Informed Consent: I have reviewed the patients History and Physical, chart, labs and discussed the procedure including the risks, benefits and alternatives for the proposed anesthesia with the patient or authorized representative who has indicated his/her understanding and acceptance.  ? ? ? ?Dental advisory given ? ?Plan Discussed with: CRNA ? ?Anesthesia Plan Comments:   ? ? ? ? ?Anesthesia Quick Evaluation ? ?

## 2021-06-07 NOTE — Assessment & Plan Note (Signed)
Suspect likely chronic. Baseline she ambulates with walker, no cognitive changes.  ?

## 2021-06-07 NOTE — Anesthesia Procedure Notes (Signed)
Anesthesia Regional Block: Femoral nerve block  ? ?Pre-Anesthetic Checklist: , timeout performed,  Correct Patient, Correct Site, Correct Laterality,  Correct Procedure, Correct Position, site marked,  Risks and benefits discussed,  Surgical consent,  Pre-op evaluation,  At surgeon's request ? ?Laterality: Left ? ?Prep: chloraprep     ?  ?Needles:  ?Injection technique: Single-shot ? ?Needle Type: Echogenic Needle   ? ? ?Needle Length: 10cm  ?Needle Gauge: 21  ? ? ? ?Additional Needles: ? ? ?Narrative:  ?Start time: 06/07/2021 11:20 AM ?End time: 06/07/2021 11:23 AM ?Injection made incrementally with aspirations every 5 mL. ? ?Performed by: Personally  ?Anesthesiologist: Audry Pili, MD ? ?Additional Notes: ?No pain on injection. No increased resistance to injection. Injection made in 5cc increments. Good needle visualization. Patient tolerated the procedure well. ? ? ? ? ?

## 2021-06-07 NOTE — Anesthesia Pain Management Evaluation Note (Signed)
?  Anesthesia Pain Consult Note ? ?Patient: Tina Clements, 81 y.o., female ? ?Consult Requested by: Orthopedics ? ?Reason for Consult: Hip pain ? ?Level of Consciousness: alert ? ?Pain: 8 /10  ? ?Last Vitals:  ?Vitals:  ? 06/07/21 1035 06/07/21 1100  ?BP:  (!) 164/84  ?Pulse:  66  ?Resp: 18 (!) 23  ?Temp:    ?SpO2: 99% 97%  ? ? ?Plan: Peripheral nerve block for pain control ? ?Consent:Risks of procedure as well as the alternatives and risks of each were explained to the (patient/caregiver).  Consent for procedure obtained. ? ? ?Allergies  ?Allergen Reactions  ?? Erythromycin   ?  Headaches  ?? Erythromycin Base   ? ? ?Physical exam: ?PULM normal  ?CARDIO normal  ?OTHER   ? ?I have reviewed the patient's medications listed below. ?? fentaNYL      ??  HYDROmorphone (DILAUDID) injection  0.5 mg Intravenous Once  ?? ondansetron (ZOFRAN) IV  4 mg Intravenous Once  ?? sodium chloride flush  3 mL Intravenous Q12H  ? ? ?HYDROcodone-acetaminophen, morphine injection, ondansetron (ZOFRAN) IV ? ?Past Medical History:  ?Diagnosis Date  ?? Arthritis   ?? Dyspnea   ? with excertion  ?? Dysrhythmia 2000  ? paplatations and fast  ?? Hyperlipidemia   ?? Sarcoidosis   ? ?Past Surgical History:  ?Procedure Laterality Date  ?? ABDOMINAL HYSTERECTOMY    ?? KNEE ARTHROSCOPY    ?? RADICAL HYSTERECTOMY WITH TRANSPOSITION OF OVARIES    ?? SHOULDER ARTHROSCOPY    ?? TONSILLECTOMY    ?? TOTAL KNEE ARTHROPLASTY Right 04/25/2019  ? Procedure: TOTAL KNEE ARTHROPLASTY;  Surgeon: Gaynelle Arabian, MD;  Location: [facility];  Service: Orthopedics;  Laterality: Right;  68mn  ? ? reports that she has never smoked. She has never used smokeless tobacco. She reports current alcohol use. She reports that she does not use drugs.  ? ? ?TAudry Pili?06/07/2021 ? ? ? ? ?

## 2021-06-07 NOTE — Plan of Care (Signed)
  Problem: Activity: Goal: Risk for activity intolerance will decrease Outcome: Progressing   Problem: Pain Managment: Goal: General experience of comfort will improve Outcome: Progressing   Problem: Safety: Goal: Ability to remain free from injury will improve Outcome: Progressing   

## 2021-06-08 ENCOUNTER — Inpatient Hospital Stay (HOSPITAL_COMMUNITY): Payer: Medicare Other

## 2021-06-08 ENCOUNTER — Inpatient Hospital Stay (HOSPITAL_COMMUNITY): Payer: Medicare Other | Admitting: Certified Registered Nurse Anesthetist

## 2021-06-08 ENCOUNTER — Encounter (HOSPITAL_COMMUNITY)
Admission: EM | Disposition: A | Discharge status: Home Health | Payer: Self-pay | Source: Home / Self Care | Attending: Internal Medicine

## 2021-06-08 DIAGNOSIS — I10 Essential (primary) hypertension: Secondary | ICD-10-CM

## 2021-06-08 DIAGNOSIS — S72002A Fracture of unspecified part of neck of left femur, initial encounter for closed fracture: Secondary | ICD-10-CM | POA: Diagnosis not present

## 2021-06-08 DIAGNOSIS — R7989 Other specified abnormal findings of blood chemistry: Secondary | ICD-10-CM | POA: Diagnosis not present

## 2021-06-08 DIAGNOSIS — M1990 Unspecified osteoarthritis, unspecified site: Secondary | ICD-10-CM

## 2021-06-08 DIAGNOSIS — G9389 Other specified disorders of brain: Secondary | ICD-10-CM | POA: Diagnosis not present

## 2021-06-08 DIAGNOSIS — R55 Syncope and collapse: Secondary | ICD-10-CM | POA: Diagnosis not present

## 2021-06-08 HISTORY — PX: TOTAL HIP ARTHROPLASTY: SHX124

## 2021-06-08 LAB — GLUCOSE, CAPILLARY: Glucose-Capillary: 118 mg/dL — ABNORMAL HIGH (ref 70–99)

## 2021-06-08 SURGERY — ARTHROPLASTY, HIP, TOTAL, ANTERIOR APPROACH
Anesthesia: General | Site: Hip | Laterality: Left

## 2021-06-08 MED ORDER — HYDROMORPHONE HCL 1 MG/ML IJ SOLN: 0.2500 mg | INTRAMUSCULAR | Status: DC | PRN

## 2021-06-08 MED ORDER — ONDANSETRON HCL 4 MG/2ML IJ SOLN: 4.0000 mg | Freq: Four times a day (QID) | INTRAMUSCULAR | Status: DC | PRN

## 2021-06-08 MED ORDER — PROPOFOL 10 MG/ML IV BOLUS
INTRAVENOUS | Status: DC | PRN
  Administered 2021-06-08 (×2): 30 mg via INTRAVENOUS
  Administered 2021-06-08: 20 mg via INTRAVENOUS

## 2021-06-08 MED ORDER — PHENYLEPHRINE HCL-NACL 20-0.9 MG/250ML-% IV SOLN
INTRAVENOUS | Status: AC
  Filled 2021-06-08: qty 250

## 2021-06-08 MED ORDER — DOCUSATE SODIUM 100 MG PO CAPS
100.0000 mg | ORAL_CAPSULE | Freq: Two times a day (BID) | ORAL | Status: DC
  Administered 2021-06-08 – 2021-06-10 (×5): 100 mg via ORAL
  Filled 2021-06-08 (×5): qty 1

## 2021-06-08 MED ORDER — BUPIVACAINE-EPINEPHRINE (PF) 0.25% -1:200000 IJ SOLN
INTRAMUSCULAR | Status: AC
  Filled 2021-06-08: qty 30

## 2021-06-08 MED ORDER — ONDANSETRON HCL 4 MG PO TABS: 4.0000 mg | ORAL_TABLET | Freq: Four times a day (QID) | ORAL | Status: DC | PRN

## 2021-06-08 MED ORDER — ISOPROPYL ALCOHOL 70 % SOLN
Status: AC
  Filled 2021-06-08: qty 480

## 2021-06-08 MED ORDER — ROCURONIUM BROMIDE 10 MG/ML (PF) SYRINGE
PREFILLED_SYRINGE | INTRAVENOUS | Status: AC
  Filled 2021-06-08: qty 10

## 2021-06-08 MED ORDER — KETOROLAC TROMETHAMINE 30 MG/ML IJ SOLN
INTRAMUSCULAR | Status: AC
  Filled 2021-06-08: qty 1

## 2021-06-08 MED ORDER — POLYETHYLENE GLYCOL 3350 17 G PO PACK: 17.0000 g | PACK | Freq: Every day | ORAL | Status: DC | PRN

## 2021-06-08 MED ORDER — KETOROLAC TROMETHAMINE 30 MG/ML IJ SOLN
INTRAMUSCULAR | Status: DC | PRN
  Administered 2021-06-08: 30 mg

## 2021-06-08 MED ORDER — LACTATED RINGERS IV SOLN: INTRAVENOUS | Status: DC | PRN

## 2021-06-08 MED ORDER — PROPOFOL 500 MG/50ML IV EMUL
INTRAVENOUS | Status: DC | PRN
Start: 2021-06-08 — End: 2021-06-08
  Administered 2021-06-08: 75 ug/kg/min via INTRAVENOUS

## 2021-06-08 MED ORDER — CHLORHEXIDINE GLUCONATE CLOTH 2 % EX PADS
6.0000 | MEDICATED_PAD | Freq: Every day | CUTANEOUS | Status: DC
  Administered 2021-06-08: 6 via TOPICAL

## 2021-06-08 MED ORDER — SODIUM CHLORIDE (PF) 0.9 % IJ SOLN
INTRAMUSCULAR | Status: AC
  Filled 2021-06-08: qty 50

## 2021-06-08 MED ORDER — BUPIVACAINE-EPINEPHRINE 0.25% -1:200000 IJ SOLN
INTRAMUSCULAR | Status: DC | PRN
  Administered 2021-06-08: 30 mL

## 2021-06-08 MED ORDER — LIDOCAINE HCL (PF) 2 % IJ SOLN
INTRAMUSCULAR | Status: AC
  Filled 2021-06-08: qty 5

## 2021-06-08 MED ORDER — SENNA 8.6 MG PO TABS
1.0000 | ORAL_TABLET | Freq: Two times a day (BID) | ORAL | Status: DC
  Administered 2021-06-08 – 2021-06-10 (×5): 8.6 mg via ORAL
  Filled 2021-06-08 (×5): qty 1

## 2021-06-08 MED ORDER — METHOCARBAMOL 500 MG PO TABS
500.0000 mg | ORAL_TABLET | Freq: Four times a day (QID) | ORAL | Status: DC | PRN
  Administered 2021-06-09 – 2021-06-10 (×5): 500 mg via ORAL
  Filled 2021-06-08 (×5): qty 1

## 2021-06-08 MED ORDER — MENTHOL 3 MG MT LOZG: 1.0000 | LOZENGE | OROMUCOSAL | Status: DC | PRN

## 2021-06-08 MED ORDER — PHENYLEPHRINE HCL-NACL 20-0.9 MG/250ML-% IV SOLN
INTRAVENOUS | Status: DC | PRN
Start: 2021-06-08 — End: 2021-06-08
  Administered 2021-06-08: 50 ug/min via INTRAVENOUS
  Administered 2021-06-08: 240 ug via INTRAVENOUS

## 2021-06-08 MED ORDER — METOCLOPRAMIDE HCL 5 MG PO TABS: 5.0000 mg | ORAL_TABLET | Freq: Three times a day (TID) | ORAL | Status: DC | PRN

## 2021-06-08 MED ORDER — EPHEDRINE SULFATE-NACL 50-0.9 MG/10ML-% IV SOSY
PREFILLED_SYRINGE | INTRAVENOUS | Status: DC | PRN
  Administered 2021-06-08 (×2): 10 mg via INTRAVENOUS

## 2021-06-08 MED ORDER — FENTANYL CITRATE (PF) 100 MCG/2ML IJ SOLN
INTRAMUSCULAR | Status: DC | PRN
  Administered 2021-06-08 (×2): 50 ug via INTRAVENOUS

## 2021-06-08 MED ORDER — SODIUM CHLORIDE 0.9 % IR SOLN
Status: DC | PRN
  Administered 2021-06-08: 3000 mL
  Administered 2021-06-08: 1000 mL

## 2021-06-08 MED ORDER — ASPIRIN 81 MG PO CHEW
81.0000 mg | CHEWABLE_TABLET | Freq: Two times a day (BID) | ORAL | Status: DC
  Administered 2021-06-08 – 2021-06-10 (×4): 81 mg via ORAL
  Filled 2021-06-08 (×4): qty 1

## 2021-06-08 MED ORDER — METOCLOPRAMIDE HCL 5 MG/ML IJ SOLN: 5.0000 mg | Freq: Three times a day (TID) | INTRAMUSCULAR | Status: DC | PRN

## 2021-06-08 MED ORDER — PHENYLEPHRINE 80 MCG/ML (10ML) SYRINGE FOR IV PUSH (FOR BLOOD PRESSURE SUPPORT)
PREFILLED_SYRINGE | INTRAVENOUS | Status: AC
  Filled 2021-06-08: qty 10

## 2021-06-08 MED ORDER — WATER FOR IRRIGATION, STERILE IR SOLN
Status: DC | PRN
  Administered 2021-06-08: 2000 mL

## 2021-06-08 MED ORDER — ONDANSETRON HCL 4 MG/2ML IJ SOLN
INTRAMUSCULAR | Status: DC | PRN
  Administered 2021-06-08: 4 mg via INTRAVENOUS

## 2021-06-08 MED ORDER — METHOCARBAMOL 500 MG IVPB - SIMPLE MED
500.0000 mg | Freq: Four times a day (QID) | INTRAVENOUS | Status: DC | PRN
  Administered 2021-06-08: 500 mg via INTRAVENOUS
  Filled 2021-06-08 (×2): qty 50

## 2021-06-08 MED ORDER — PROPOFOL 10 MG/ML IV BOLUS
INTRAVENOUS | Status: AC
  Filled 2021-06-08: qty 20

## 2021-06-08 MED ORDER — PHENOL 1.4 % MT LIQD: 1.0000 | OROMUCOSAL | Status: DC | PRN

## 2021-06-08 MED ORDER — ONDANSETRON HCL 4 MG/2ML IJ SOLN
INTRAMUSCULAR | Status: AC
  Filled 2021-06-08: qty 2

## 2021-06-08 MED ORDER — FENTANYL CITRATE (PF) 100 MCG/2ML IJ SOLN
INTRAMUSCULAR | Status: AC
  Filled 2021-06-08: qty 2

## 2021-06-08 MED ORDER — ISOPROPYL ALCOHOL 70 % SOLN
Status: DC | PRN
  Administered 2021-06-08: 1 via TOPICAL

## 2021-06-08 MED ORDER — PROPOFOL 1000 MG/100ML IV EMUL
INTRAVENOUS | Status: AC
  Filled 2021-06-08: qty 100

## 2021-06-08 MED ORDER — DROPERIDOL 2.5 MG/ML IJ SOLN: 0.6250 mg | Freq: Once | INTRAMUSCULAR | Status: DC | PRN

## 2021-06-08 MED ORDER — DEXAMETHASONE SODIUM PHOSPHATE 10 MG/ML IJ SOLN
10.0000 mg | Freq: Once | INTRAMUSCULAR | Status: AC
  Administered 2021-06-09: 10 mg via INTRAVENOUS
  Filled 2021-06-08: qty 1

## 2021-06-08 MED ORDER — SODIUM CHLORIDE 0.9 % IV SOLN: INTRAVENOUS | Status: DC

## 2021-06-08 MED ORDER — SODIUM CHLORIDE (PF) 0.9 % IJ SOLN
INTRAMUSCULAR | Status: DC | PRN
  Administered 2021-06-08: 30 mL

## 2021-06-08 MED ORDER — DEXAMETHASONE SODIUM PHOSPHATE 10 MG/ML IJ SOLN
INTRAMUSCULAR | Status: DC | PRN
  Administered 2021-06-08: 8 mg via INTRAVENOUS

## 2021-06-08 MED ORDER — BUPIVACAINE IN DEXTROSE 0.75-8.25 % IT SOLN
INTRATHECAL | Status: DC | PRN
Start: 2021-06-08 — End: 2021-06-08
  Administered 2021-06-08: 2 mL via INTRATHECAL

## 2021-06-08 MED ORDER — ALBUMIN HUMAN 5 % IV SOLN: INTRAVENOUS | Status: DC | PRN

## 2021-06-08 MED ORDER — ALUM & MAG HYDROXIDE-SIMETH 200-200-20 MG/5ML PO SUSP: 30.0000 mL | ORAL | Status: DC | PRN

## 2021-06-08 MED ORDER — EPHEDRINE 5 MG/ML INJ
INTRAVENOUS | Status: AC
  Filled 2021-06-08: qty 5

## 2021-06-08 MED ORDER — DEXAMETHASONE SODIUM PHOSPHATE 10 MG/ML IJ SOLN
INTRAMUSCULAR | Status: AC
  Filled 2021-06-08: qty 1

## 2021-06-08 MED ORDER — CEFAZOLIN SODIUM-DEXTROSE 2-4 GM/100ML-% IV SOLN
2.0000 g | Freq: Four times a day (QID) | INTRAVENOUS | Status: AC
  Administered 2021-06-08 (×2): 2 g via INTRAVENOUS
  Filled 2021-06-08 (×2): qty 100

## 2021-06-08 MED ORDER — DIPHENHYDRAMINE HCL 12.5 MG/5ML PO ELIX: 12.5000 mg | ORAL_SOLUTION | ORAL | Status: DC | PRN

## 2021-06-08 SURGICAL SUPPLY — 65 items
ACE SHELL 3H 52 E HIP (Shell) ×2 IMPLANT
ADH SKN CLS APL DERMABOND .7 (GAUZE/BANDAGES/DRESSINGS) ×1
APL PRP STRL LF DISP 70% ISPRP (MISCELLANEOUS) ×1
BAG COUNTER SPONGE SURGICOUNT (BAG) IMPLANT
BAG DECANTER FOR FLEXI CONT (MISCELLANEOUS) IMPLANT
BAG SPNG CNTER NS LX DISP (BAG)
BAG ZIPLOCK 12X15 (MISCELLANEOUS) IMPLANT
CHLORAPREP W/TINT 26 (MISCELLANEOUS) ×2 IMPLANT
COVER PERINEAL POST (MISCELLANEOUS) ×2 IMPLANT
COVER SURGICAL LIGHT HANDLE (MISCELLANEOUS) ×2 IMPLANT
DERMABOND ADVANCED (GAUZE/BANDAGES/DRESSINGS) ×1
DERMABOND ADVANCED .7 DNX12 (GAUZE/BANDAGES/DRESSINGS) ×2 IMPLANT
DRAPE IMP U-DRAPE 54X76 (DRAPES) ×2 IMPLANT
DRAPE SHEET LG 3/4 BI-LAMINATE (DRAPES) ×6 IMPLANT
DRAPE STERI IOBAN 125X83 (DRAPES) ×2 IMPLANT
DRAPE U-SHAPE 47X51 STRL (DRAPES) ×4 IMPLANT
DRSG AQUACEL AG ADV 3.5X10 (GAUZE/BANDAGES/DRESSINGS) ×2 IMPLANT
ELECT REM PT RETURN 15FT ADLT (MISCELLANEOUS) ×2 IMPLANT
GAUZE SPONGE 4X4 12PLY STRL (GAUZE/BANDAGES/DRESSINGS) ×2 IMPLANT
GLOVE BIO SURGEON STRL SZ8.5 (GLOVE) ×4 IMPLANT
GLOVE BIOGEL M 7.0 STRL (GLOVE) ×2 IMPLANT
GLOVE BIOGEL PI IND STRL 7.5 (GLOVE) ×1 IMPLANT
GLOVE BIOGEL PI IND STRL 8 (GLOVE) ×1 IMPLANT
GLOVE BIOGEL PI IND STRL 8.5 (GLOVE) ×1 IMPLANT
GLOVE BIOGEL PI INDICATOR 7.5 (GLOVE) ×1
GLOVE BIOGEL PI INDICATOR 8 (GLOVE) ×1
GLOVE BIOGEL PI INDICATOR 8.5 (GLOVE) ×1
GLOVE SURG LX 7.5 STRW (GLOVE) ×2
GLOVE SURG LX STRL 7.5 STRW (GLOVE) ×2 IMPLANT
GOWN SPEC L3 XXLG W/TWL (GOWN DISPOSABLE) ×4 IMPLANT
HANDPIECE INTERPULSE COAX TIP (DISPOSABLE) ×2
HD FEM MOD 36M STD (Miscellaneous) ×2 IMPLANT
HEAD FEM MOD 36M STD (Miscellaneous) IMPLANT
HOLDER FOLEY CATH W/STRAP (MISCELLANEOUS) ×2 IMPLANT
HOOD PEEL AWAY FLYTE STAYCOOL (MISCELLANEOUS) ×6 IMPLANT
KIT TURNOVER KIT A (KITS) IMPLANT
LINER ACE G7 HIGH 36 SZ E (Liner) ×1 IMPLANT
MANIFOLD NEPTUNE II (INSTRUMENTS) ×2 IMPLANT
MARKER SKIN DUAL TIP RULER LAB (MISCELLANEOUS) ×2 IMPLANT
NDL SAFETY ECLIPSE 18X1.5 (NEEDLE) ×1 IMPLANT
NDL SPNL 18GX3.5 QUINCKE PK (NEEDLE) ×1 IMPLANT
NEEDLE HYPO 18GX1.5 SHARP (NEEDLE) ×2
NEEDLE SPNL 18GX3.5 QUINCKE PK (NEEDLE) ×2 IMPLANT
PACK ANTERIOR HIP CUSTOM (KITS) ×2 IMPLANT
PENCIL SMOKE EVACUATOR (MISCELLANEOUS) IMPLANT
SAW OSC TIP CART 19.5X105X1.3 (SAW) ×2 IMPLANT
SEALER BIPOLAR AQUA 6.0 (INSTRUMENTS) ×2 IMPLANT
SET HNDPC FAN SPRY TIP SCT (DISPOSABLE) ×1 IMPLANT
SHELL ACETAB 3H 52 E HIP (Shell) IMPLANT
SOLUTION PRONTOSAN WOUND 350ML (IRRIGATION / IRRIGATOR) ×2 IMPLANT
SPIKE FLUID TRANSFER (MISCELLANEOUS) ×2 IMPLANT
SPONGE T-LAP 18X18 ~~LOC~~+RFID (SPONGE) ×6 IMPLANT
STAPLER INSORB 30 2030 C-SECTI (MISCELLANEOUS) IMPLANT
STEM FEM CMTLS STD 14X146 (Stem) ×1 IMPLANT
SUT MNCRL AB 3-0 PS2 18 (SUTURE) ×2 IMPLANT
SUT MON AB 2-0 CT1 36 (SUTURE) ×2 IMPLANT
SUT STRATAFIX PDO 1 14 VIOLET (SUTURE) ×2
SUT STRATFX PDO 1 14 VIOLET (SUTURE) ×1
SUT VIC AB 2-0 CT1 27 (SUTURE)
SUT VIC AB 2-0 CT1 TAPERPNT 27 (SUTURE) IMPLANT
SUTURE STRATFX PDO 1 14 VIOLET (SUTURE) ×1 IMPLANT
SYR 3ML LL SCALE MARK (SYRINGE) ×2 IMPLANT
TRAY FOLEY MTR SLVR 16FR STAT (SET/KITS/TRAYS/PACK) IMPLANT
TUBE SUCTION HIGH CAP CLEAR NV (SUCTIONS) ×2 IMPLANT
WATER STERILE IRR 1000ML POUR (IV SOLUTION) ×2 IMPLANT

## 2021-06-08 NOTE — Consult Note (Signed)
? ? ?ORTHOPAEDIC CONSULTATION ? ?REQUESTING PHYSICIAN: Aline August, MD ? ?PCP:  [name], MD ? ?Chief Complaint: Left hip injury ? ?HPI: ?Tina Clements is a 81 y.o. female with a past medical history of osteoarthritis, osteopenia, and hyperlipidemia who frequently gets dizzy.  She had a ground-level fall after she got dizzy at home on 06/03/2021.  She had hip pain and inability to weight-bear.  When she did not get any better, she came to the emergency department at [facility].  X-rays and CT scan of the left hip revealed a comminuted, impacted, mildly retroverted subcapital femoral neck fracture.  She was admitted by the hospitalist for perioperative restratification and medical optimization.  Orthopedic consultation was placed for management of her hip fracture.  I was asked to see the patient by my partner, Dr. Kathaleen Bury.  At baseline, she ambulates with a walker.  She does her own grocery shopping.  She is a patient of Dr. Wynelle Link, and had a right total knee replacement 2 years ago. ? ?Past Medical History:  ?Diagnosis Date  ? Arthritis   ? Dyspnea   ? with excertion  ? Dysrhythmia 2000  ? paplatations and fast  ? Hyperlipidemia   ? Sarcoidosis   ? ?Past Surgical History:  ?Procedure Laterality Date  ? ABDOMINAL HYSTERECTOMY    ? KNEE ARTHROSCOPY    ? RADICAL HYSTERECTOMY WITH TRANSPOSITION OF OVARIES    ? SHOULDER ARTHROSCOPY    ? TONSILLECTOMY    ? TOTAL KNEE ARTHROPLASTY Right 04/25/2019  ? Procedure: TOTAL KNEE ARTHROPLASTY;  Surgeon: Gaynelle Arabian, MD;  Location: [facility];  Service: Orthopedics;  Laterality: Right;  46mn  ? ?Social History  ? ?Socioeconomic History  ? Marital status: Married  ?  Spouse name: Not on file  ? Number of children: Not on file  ? Years of education: Not on file  ? Highest education level: Not on file  ?Occupational History  ? Not on file  ?Tobacco Use  ? Smoking status: Never  ? Smokeless tobacco: Never  ?Vaping Use  ? Vaping Use: Never used  ?Substance  and Sexual Activity  ? Alcohol use: Yes  ?  Comment: occasional wine   ? Drug use: No  ? Sexual activity: Yes  ?  Birth control/protection: None  ?Other Topics Concern  ? Not on file  ?Social History Narrative  ? Not on file  ? ?Social Determinants of Health  ? ?Financial Resource Strain: Not on file  ?Food Insecurity: Not on file  ?Transportation Needs: Not on file  ?Physical Activity: Not on file  ?Stress: Not on file  ?Social Connections: Not on file  ? ?Family History  ?Problem Relation Age of Onset  ? Heart disease Mother   ? Cancer Father   ? Hepatitis C Sister   ? Sarcoidosis Sister   ? ?Allergies  ?Allergen Reactions  ? Erythromycin   ?  Headaches  ? Erythromycin Base   ? ?Prior to Admission medications   ?Medication Sig Start Date End Date Taking? Authorizing Provider  ?acetaminophen (TYLENOL) 500 MG tablet Take 1,000 mg by mouth every 6 (six) hours as needed for mild pain or moderate pain.   Yes [provider]  ?ergocalciferol (VITAMIN D2) 1.25 MG (50000 UT) capsule Take 50,000 Units by mouth once a week. Fridays   Yes [provider]  ?trolamine salicylate (ASPERCREME) 10 % cream Apply 1 application. topically as needed (applies to knee when needed.).   Yes [provider]  ? ?  DG Wrist Complete Left ? ?Result Date: 06/06/2021 ?CLINICAL DATA:  fall EXAM: LEFT WRIST - COMPLETE 3+ VIEW COMPARISON:  Wrist radiographs from 03/11/2010. FINDINGS: There is no evidence of fracture or dislocation. There is no evidence of arthropathy or other focal bone abnormality. Soft tissues are unremarkable. IMPRESSION: Negative. Electronically Signed   By: Margaretha Sheffield M.D.   On: 06/06/2021 19:14  ? ?CT HEAD WO CONTRAST (5MM) ? ?Result Date: 06/06/2021 ?CLINICAL DATA:  Syncope/presyncope, cerebrovascular cause suspected EXAM: CT HEAD WITHOUT CONTRAST TECHNIQUE: Contiguous axial images were obtained from the base of the skull through the vertex without intravenous contrast. RADIATION DOSE  REDUCTION: This exam was performed according to the departmental dose-optimization program which includes automated exposure control, adjustment of the mA and/or kV according to patient size and/or use of iterative reconstruction technique. COMPARISON:  None. FINDINGS: Brain: Ventriculomegaly, which is out of proportion to the degree of sulcal enlargement. There also is some crowding of sulci at the vertex with mildly acute callosal angle. No evidence of acute large vascular territory infarct, acute hemorrhage, mass lesion, or midline shift. Vascular: No hyperdense vessel identified. Skull: No acute fracture. Sinuses/Orbits: Clear sinuses.  No acute orbital findings. Other: No mastoid effusions. IMPRESSION: Ventriculomegaly with some findings that can be seen with normal pressure hydrocephalus (NPH). Central predominant atrophy is a differential consideration. Recommend correlation with the presence or absence of signs/symptoms of NPH. Electronically Signed   By: Margaretha Sheffield M.D.   On: 06/06/2021 19:19  ? ?CT HIP LEFT WO CONTRAST ? ?Result Date: 06/07/2021 ?CLINICAL DATA:  Left hip fracture. EXAM: CT OF THE LEFT HIP WITHOUT CONTRAST TECHNIQUE: Multidetector CT imaging of the left hip was performed according to the standard protocol. Multiplanar CT image reconstructions were also generated. RADIATION DOSE REDUCTION: This exam was performed according to the departmental dose-optimization program which includes automated exposure control, adjustment of the mA and/or kV according to patient size and/or use of iterative reconstruction technique. COMPARISON:  Left hip x-rays from yesterday. FINDINGS: Bones/Joint/Cartilage Unchanged acute mildly impacted subcapital left femoral neck fracture. No additional fracture. No dislocation. Mild bilateral hip osteoarthritis. Osteopenia. Small left hip joint effusion. Degenerative changes of the pubic symphysis and sacroiliac joints. Severe lower lumbar degenerative disc  disease. Advanced lower lumbar facet arthropathy with grade 1 anterolisthesis at L5-S1. Ligaments Ligaments are suboptimally evaluated by CT. Muscles and Tendons Grossly intact.  No muscle atrophy. Soft tissue No fluid collection or hematoma.  No soft tissue mass. Sigmoid colonic diverticulosis. Aortoiliac atherosclerotic vascular disease. IMPRESSION: 1. Unchanged acute mildly impacted subcapital left femoral neck fracture. Electronically Signed   By: Titus Dubin M.D.   On: 06/07/2021 09:48  ? ?DG Knee Left Port ? ?Result Date: 06/07/2021 ?CLINICAL DATA:  81 year old female with history femoral fracture. EXAM: PORTABLE LEFT KNEE - 1-2 VIEW COMPARISON:  01/05/2009 FINDINGS: No acute fracture or malalignment. Moderate lateral compartment and mild medial compartment joint space narrowing with associated subchondral sclerosis and mild periarticular osteophyte formation. Mild quadriceps patellar enthesopathy. Soft tissues are within normal limits. IMPRESSION: 1. No acute fracture or malalignment. 2. Moderate tricompartmental degenerative changes, most pronounced about the lateral compartment. Electronically Signed   By: Ruthann Cancer M.D.   On: 06/07/2021 08:44  ? ?ECHOCARDIOGRAM COMPLETE ? ?Result Date: 06/07/2021 ?   ECHOCARDIOGRAM REPORT   Patient Name:   Arthur Holms Date of Exam: 06/07/2021 Medical Rec #:  [id number]         Height:       66.5 in  Accession #:    [id number]        Weight:       160.0 lb Date of Birth:  Jul 07, 1940          BSA:          1.829 m? Patient Age:    74 years          BP:           150/85 mmHg Patient Gender: F                 HR:           71 bpm. Exam Location:  Inpatient Procedure: 2D Echo, Cardiac Doppler and Color Doppler Indications:    Syncope  History:        Patient has no prior history of Echocardiogram examinations.                 Risk Factors:Hypertension.  Sonographer:    Jefferey Pica Referring Phys: [id number] [place]  1. Left ventricular ejection fraction,  by estimation, is 60 to 65%. The left ventricle has normal function. The left ventricle has no regional wall motion abnormalities. Left ventricular diastolic parameters are consistent with Grade I diastolic dysfun

## 2021-06-08 NOTE — Progress Notes (Signed)
Pt in pre- op site from floor . ?

## 2021-06-08 NOTE — Anesthesia Postprocedure Evaluation (Signed)
Anesthesia Post Note ? ?Patient: Tina Clements ? ?Procedure(s) Performed: TOTAL HIP ARTHROPLASTY ANTERIOR APPROACH (Left: Hip) ? ?  ? ?Patient location during evaluation: PACU ?Anesthesia Type: General ?Level of consciousness: sedated and patient cooperative ?Pain management: pain level controlled ?Vital Signs Assessment: post-procedure vital signs reviewed and stable ?Respiratory status: spontaneous breathing ?Cardiovascular status: stable ?Anesthetic complications: no ? ? ?No notable events documented. ? ?Last Vitals:  ?Vitals:  ? 06/08/21 1115 06/08/21 1141  ?BP: (!) 112/55 (!) 120/56  ?Pulse: (!) 54 (!) 56  ?Resp: 15 16  ?Temp:  36.5 ?C  ?SpO2: 98% 100%  ?  ?Last Pain:  ?Vitals:  ? 06/08/21 1153  ?TempSrc:   ?PainSc: 0-No pain  ? ? ?  ?  ?  ?  ?  ?  ? ?Nolon Nations ? ? ? ? ?

## 2021-06-08 NOTE — Transfer of Care (Signed)
Immediate Anesthesia Transfer of Care Note ? ?Patient: Tina Clements ? ?Procedure(s) Performed: TOTAL HIP ARTHROPLASTY ANTERIOR APPROACH (Left: Hip) ? ?Patient Location: PACU ? ?Anesthesia Type:MAC and Spinal ? ?Level of Consciousness: awake, alert  and oriented ? ?Airway & Oxygen Therapy: Patient Spontanous Breathing and Patient connected to face mask ? ?Post-op Assessment: Report given to RN and Post -op Vital signs reviewed and stable ? ?Post vital signs: Reviewed and stable ? ?Last Vitals:  ?Vitals Value Taken Time  ?BP 107/68 06/08/21 1028  ?Temp    ?Pulse 64 06/08/21 1028  ?Resp 22 06/08/21 1028  ?SpO2 97 % 06/08/21 1028  ?Vitals shown include unvalidated device data. ? ?Last Pain:  ?Vitals:  ? 06/08/21 0528  ?TempSrc: Oral  ?PainSc:   ?   ? ?Patients Stated Pain Goal: 3 (06/07/21 1500) ? ?Complications: No notable events documented. ?

## 2021-06-08 NOTE — Discharge Instructions (Signed)
? ?Dr. Lanie Schelling ?Joint Replacement Specialist ?[facility] ?[address] ?, [place] [address] ?[phone] ? ? ?TOTAL HIP REPLACEMENT POSTOPERATIVE DIRECTIONS ? ? ? ?Hip Rehabilitation, Guidelines Following Surgery  ? ?WEIGHT BEARING ?Weight bearing as tolerated with assist device (walker, cane, etc) as directed, use it as long as suggested by your surgeon or therapist, typically at least 4-6 weeks. ? ?The results of a hip operation are greatly improved after range of motion and muscle strengthening exercises. Follow all safety measures which are given to protect your hip. If any of these exercises cause increased pain or swelling in your joint, decrease the amount until you are comfortable again. Then slowly increase the exercises. Call your caregiver if you have problems or questions.  ? ?HOME CARE INSTRUCTIONS  ?Most of the following instructions are designed to prevent the dislocation of your new hip.  ?Remove items at home which could result in a fall. This includes throw rugs or furniture in walking pathways.  ?Continue medications as instructed at time of discharge. ?You may have some home medications which will be placed on hold until you complete the course of blood thinner medication. ?You may start showering once you are discharged home. Do not remove your dressing. ?Do not put on socks or shoes without following the instructions of your caregivers.   ?Sit on chairs with arms. Use the chair arms to help push yourself up when arising.  ?Arrange for the use of a toilet seat elevator so you are not sitting low.  ?Walk with walker as instructed.  ?You may resume a sexual relationship in one month or when given the OK by your caregiver.  ?Use walker as long as suggested by your caregivers.  ?You may put full weight on your legs and walk as much as is comfortable. ?Avoid periods of inactivity such as sitting longer than an hour when not asleep. This helps prevent blood  clots.  ?You may return to work once you are cleared by your surgeon.  ?Do not drive a car for 6 weeks or until released by your surgeon.  ?Do not drive while taking narcotics.  ?Wear elastic stockings for two weeks following surgery during the day but you may remove then at night.  ?Make sure you keep all of your appointments after your operation with all of your doctors and caregivers. You should call the office at the above phone number and make an appointment for approximately two weeks after the date of your surgery. ?Please pick up a stool softener and laxative for home use as long as you are requiring pain medications. ?ICE to the affected hip every three hours for 30 minutes at a time and then as needed for pain and swelling. Continue to use ice on the hip for pain and swelling from surgery. You may notice swelling that will progress down to the foot and ankle.  This is normal after surgery.  Elevate the leg when you are not up walking on it.   ?It is important for you to complete the blood thinner medication as prescribed by your doctor. ?Continue to use the breathing machine which will help keep your temperature down.  It is common for your temperature to cycle up and down following surgery, especially at night when you are not up moving around and exerting yourself.  The breathing machine keeps your lungs expanded and your temperature down. ? ?RANGE OF MOTION AND STRENGTHENING EXERCISES  ?These exercises are designed to help you   keep full movement of your hip joint. Follow your caregiver's or physical therapist's instructions. Perform all exercises about fifteen times, three times per day or as directed. Exercise both hips, even if you have had only one joint replacement. These exercises can be done on a training (exercise) mat, on the floor, on a table or on a bed. Use whatever works the best and is most comfortable for you. Use music or television while you are exercising so that the exercises are a  pleasant break in your day. This will make your life better with the exercises acting as a break in routine you can look forward to.  ?Lying on your back, slowly slide your foot toward your buttocks, raising your knee up off the floor. Then slowly slide your foot back down until your leg is straight again.  ?Lying on your back spread your legs as far apart as you can without causing discomfort.  ?Lying on your side, raise your upper leg and foot straight up from the floor as far as is comfortable. Slowly lower the leg and repeat.  ?Lying on your back, tighten up the muscle in the front of your thigh (quadriceps muscles). You can do this by keeping your leg straight and trying to raise your heel off the floor. This helps strengthen the largest muscle supporting your knee.  ?Lying on your back, tighten up the muscles of your buttocks both with the legs straight and with the knee bent at a comfortable angle while keeping your heel on the floor.  ? ?SKILLED REHAB INSTRUCTIONS: ?If the patient is transferred to a skilled rehab facility following release from the hospital, a list of the current medications will be sent to the facility for the patient to continue.  When discharged from the skilled rehab facility, please have the facility set up the patient's Home Health Physical Therapy prior to being released. Also, the skilled facility will be responsible for providing the patient with their medications at time of release from the facility to include their pain medication and their blood thinner medication. If the patient is still at the rehab facility at time of the two week follow up appointment, the skilled rehab facility will also need to assist the patient in arranging follow up appointment in our office and any transportation needs. ? ?POST-OPERATIVE OPIOID TAPER INSTRUCTIONS: ?It is important to wean off of your opioid medication as soon as possible. If you do not need pain medication after your surgery it is ok  to stop day one. ?Opioids include: ?Codeine, Hydrocodone(Norco, Vicodin), Oxycodone(Percocet, oxycontin) and hydromorphone amongst others.  ?Long term and even short term use of opiods can cause: ?Increased pain response ?Dependence ?Constipation ?Depression ?Respiratory depression ?And more.  ?Withdrawal symptoms can include ?Flu like symptoms ?Nausea, vomiting ?And more ?Techniques to manage these symptoms ?Hydrate well ?Eat regular healthy meals ?Stay active ?Use relaxation techniques(deep breathing, meditating, yoga) ?Do Not substitute Alcohol to help with tapering ?If you have been on opioids for less than two weeks and do not have pain than it is ok to stop all together.  ?Plan to wean off of opioids ?This plan should start within one week post op of your joint replacement. ?Maintain the same interval or time between taking each dose and first decrease the dose.  ?Cut the total daily intake of opioids by one tablet each day ?Next start to increase the time between doses. ?The last dose that should be eliminated is the evening dose.  ? ? ?MAKE   SURE YOU:  ?Understand these instructions.  ?Will watch your condition.  ?Will get help right away if you are not doing well or get worse. ? ?Pick up stool softner and laxative for home use following surgery while on pain medications. ?Do not remove your dressing. ?The dressing is waterproof--it is OK to take showers. ?Continue to use ice for pain and swelling after surgery. ?Do not use any lotions or creams on the incision until instructed by your surgeon. ?Total Hip Protocol. ? ?

## 2021-06-08 NOTE — Progress Notes (Signed)
?PROGRESS NOTE ? ? ? ?Tina Clements  FAO:[id number] DOB: April 04, 1940 DOA: 06/06/2021 ?PCP: [name], MD  ? ?Brief Narrative:  ? 81 y.o. female with medical history significant of  hyperlipidemia,OA, and osteopenia presented with syncope with loss of consciousness and fall.  On presentation, AST was 54, ALT of 63.  Hip x-ray showed impacted left subcapital femoral neck fracture.  Orthopedics was consulted.  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 ? ?Assessment & Plan: ?  ?Left subcapital femoral neck fracture following a fall ?-Orthopedic surgery planning for surgery today. ?-Pain management.  Fall precautions ? ?Syncope and collapse ?-Unclear etiology.  Continue telemetry monitoring.  Echo showed EF of 60 to 65% with grade 1 diastolic dysfunction. ?-PT eval after surgery ? ?Cerebral ventriculomegaly ?-Most likely chronic. ?-Baseline she ambulates with walker, no cognitive changes. ?-Will benefit from outpatient neurology evaluation ? ?Abnormal LFTs ?-Questionable cause.  Improving.  Monitor intermittently.  No labs today ? ?Microcytic anemia ?-Questionable cause.  Hemoglobin stable. ? ? ?DVT prophylaxis: Hold Lovenox for surgical intervention ?Code Status: Full ?Family Communication: Husband at bedside on 06/07/2021 ?Disposition Plan: ?Status is: Inpatient ?Remains inpatient appropriate because: Of severity of illness ? ? ? ?Consultants: Orthopedics ? ?Procedures: None ? ?Antimicrobials: None ? ? ?Subjective: ?Patient seen and examined at bedside.  No worsening shortness of breath, fever, vomiting reported.  Has intermittent left hip pain. ?Objective: ?Vitals:  ? 06/08/21 0500 06/08/21 0528 06/08/21 6962 06/08/21 0649  ?BP:  (!) 147/87 (!) 163/73 (!) 163/73  ?Pulse:  71 68 72  ?Resp:  19 17 (!) 21  ?Temp:  98.6 ?F (37 ?C) 98.2 ?F (36.8 ?C)   ?TempSrc:  Oral    ?SpO2:  98% 100% 97%  ?Weight: 73 kg     ?Height:      ? ? ?Intake/Output Summary (Last 24 hours) at 06/08/2021  0807 ?Last data filed at 06/07/2021 1812 ?Gross per 24 hour  ?Intake 66.67 ml  ?Output 950 ml  ?Net -883.33 ml  ? ?Filed Weights  ? 06/06/21 1823 06/07/21 1431 06/08/21 0500  ?Weight: 72.6 kg 73.9 kg 73 kg  ? ? ?Examination: ? ?General: On room air.  No distress.  ?respiratory: Decreased breath sounds at bases bilaterally with some crackles ?CVS: Currently rate controlled; S1-S2 heard  ?abdominal: Soft, nontender, slightly distended, no organomegaly; normal bowel sounds are heard  ?extremities: Trace lower extremity edema; no clubbing.   ? ? ? ? ?Data Reviewed: I have personally reviewed following labs and imaging studies ? ?CBC: ?Recent Labs  ?Lab 06/06/21 ?2053 06/07/21 ?9528  ?WBC 9.0 7.0  ?NEUTROABS 5.3  --   ?HGB 11.5* 10.3*  ?HCT 36.1 31.5*  ?MCV 80.6 78.9*  ?PLT 180 165  ? ? ?Basic Metabolic Panel: ?Recent Labs  ?Lab 06/06/21 ?2053 06/07/21 ?4132  ?NA 140 141  ?K 3.8 3.5  ?CL 108 109  ?CO2 24 25  ?GLUCOSE 114* 112*  ?BUN 15 13  ?CREATININE 0.87 0.78  ?CALCIUM 9.3 9.0  ? ? ?GFR: ?Estimated Creatinine Clearance: 56.4 mL/min (by C-G formula based on SCr of 0.78 mg/dL). ?Liver Function Tests: ?Recent Labs  ?Lab 06/06/21 ?2053 06/07/21 ?4401  ?AST 54* 34  ?ALT 63* 49*  ?ALKPHOS 116 100  ?BILITOT 1.2 1.4*  ?PROT 7.2 6.1*  ?ALBUMIN 4.3 3.6  ? ? ?No results for input(s): LIPASE, AMYLASE in the last 168 hours. ?No results for input(s): AMMONIA in the last 168 hours. ?Coagulation Profile: ?No results for input(s): INR,  PROTIME in the last 168 hours. ?Cardiac Enzymes: ?No results for input(s): CKTOTAL, CKMB, CKMBINDEX, TROPONINI in the last 168 hours. ?BNP (last 3 results) ?No results for input(s): PROBNP in the last 8760 hours. ?HbA1C: ?No results for input(s): HGBA1C in the last 72 hours. ?CBG: ?Recent Labs  ?Lab 06/06/21 ?1814 06/08/21 ?5053  ?GLUCAP 121* 118*  ? ? ?Lipid Profile: ?No results for input(s): CHOL, HDL, LDLCALC, TRIG, CHOLHDL, LDLDIRECT in the last 72 hours. ?Thyroid Function Tests: ?No results for  input(s): TSH, T4TOTAL, FREET4, T3FREE, THYROIDAB in the last 72 hours. ?Anemia Panel: ?No results for input(s): VITAMINB12, FOLATE, FERRITIN, TIBC, IRON, RETICCTPCT in the last 72 hours. ?Sepsis Labs: ?No results for input(s): PROCALCITON, LATICACIDVEN in the last 168 hours. ? ?No results found for this or any previous visit (from the past 240 hour(s)).  ? ? ? ? ? ?Radiology Studies: ?DG Wrist Complete Left ? ?Result Date: 06/06/2021 ?CLINICAL DATA:  fall EXAM: LEFT WRIST - COMPLETE 3+ VIEW COMPARISON:  Wrist radiographs from 03/11/2010. FINDINGS: There is no evidence of fracture or dislocation. There is no evidence of arthropathy or other focal bone abnormality. Soft tissues are unremarkable. IMPRESSION: Negative. Electronically Signed   By: Margaretha Sheffield M.D.   On: 06/06/2021 19:14  ? ?CT HEAD WO CONTRAST (5MM) ? ?Result Date: 06/06/2021 ?CLINICAL DATA:  Syncope/presyncope, cerebrovascular cause suspected EXAM: CT HEAD WITHOUT CONTRAST TECHNIQUE: Contiguous axial images were obtained from the base of the skull through the vertex without intravenous contrast. RADIATION DOSE REDUCTION: This exam was performed according to the departmental dose-optimization program which includes automated exposure control, adjustment of the mA and/or kV according to patient size and/or use of iterative reconstruction technique. COMPARISON:  None. FINDINGS: Brain: Ventriculomegaly, which is out of proportion to the degree of sulcal enlargement. There also is some crowding of sulci at the vertex with mildly acute callosal angle. No evidence of acute large vascular territory infarct, acute hemorrhage, mass lesion, or midline shift. Vascular: No hyperdense vessel identified. Skull: No acute fracture. Sinuses/Orbits: Clear sinuses.  No acute orbital findings. Other: No mastoid effusions. IMPRESSION: Ventriculomegaly with some findings that can be seen with normal pressure hydrocephalus (NPH). Central predominant atrophy is a  differential consideration. Recommend correlation with the presence or absence of signs/symptoms of NPH. Electronically Signed   By: Margaretha Sheffield M.D.   On: 06/06/2021 19:19  ? ?CT HIP LEFT WO CONTRAST ? ?Result Date: 06/07/2021 ?CLINICAL DATA:  Left hip fracture. EXAM: CT OF THE LEFT HIP WITHOUT CONTRAST TECHNIQUE: Multidetector CT imaging of the left hip was performed according to the standard protocol. Multiplanar CT image reconstructions were also generated. RADIATION DOSE REDUCTION: This exam was performed according to the departmental dose-optimization program which includes automated exposure control, adjustment of the mA and/or kV according to patient size and/or use of iterative reconstruction technique. COMPARISON:  Left hip x-rays from yesterday. FINDINGS: Bones/Joint/Cartilage Unchanged acute mildly impacted subcapital left femoral neck fracture. No additional fracture. No dislocation. Mild bilateral hip osteoarthritis. Osteopenia. Small left hip joint effusion. Degenerative changes of the pubic symphysis and sacroiliac joints. Severe lower lumbar degenerative disc disease. Advanced lower lumbar facet arthropathy with grade 1 anterolisthesis at L5-S1. Ligaments Ligaments are suboptimally evaluated by CT. Muscles and Tendons Grossly intact.  No muscle atrophy. Soft tissue No fluid collection or hematoma.  No soft tissue mass. Sigmoid colonic diverticulosis. Aortoiliac atherosclerotic vascular disease. IMPRESSION: 1. Unchanged acute mildly impacted subcapital left femoral neck fracture. Electronically Signed   By: Orville Govern.D.  On: 06/07/2021 09:48  ? ?DG Knee Left Port ? ?Result Date: 06/07/2021 ?CLINICAL DATA:  81 year old female with history femoral fracture. EXAM: PORTABLE LEFT KNEE - 1-2 VIEW COMPARISON:  01/05/2009 FINDINGS: No acute fracture or malalignment. Moderate lateral compartment and mild medial compartment joint space narrowing with associated subchondral sclerosis and mild  periarticular osteophyte formation. Mild quadriceps patellar enthesopathy. Soft tissues are within normal limits. IMPRESSION: 1. No acute fracture or malalignment. 2. Moderate tricompartmental degenerative changes, most pr

## 2021-06-08 NOTE — Anesthesia Procedure Notes (Addendum)
Spinal ? ?Patient location during procedure: OR ?Start time: 06/08/2021 7:55 AM ?End time: 06/08/2021 8:04 AM ?Reason for block: surgical anesthesia ?Staffing ?Performed: anesthesiologist  ?Anesthesiologist: Nolon Nations, MD ?Preanesthetic Checklist ?Completed: patient identified, IV checked, site marked, risks and benefits discussed, surgical consent, monitors and equipment checked, pre-op evaluation and timeout performed ?Spinal Block ?Prep: DuraPrep and site prepped and draped ?Patient monitoring: heart rate, continuous pulse ox and blood pressure ?Approach: right paramedian ?Location: L2-3 ?Injection technique: single-shot ?Needle ?Needle type: Spinocan  ?Needle gauge: 25 G ?Needle length: 9 cm ?Assessment ?Events: CSF return ?Additional Notes ?Time out performed. Pt denies anticoagulants. Platelets/coags verified. Expiration date of kit checked and confirmed. Patient tolerated procedure well, without complications. ? ? ? ?

## 2021-06-08 NOTE — Op Note (Signed)
OPERATIVE REPORT ? ?SURGEON: Rod Can, MD  ? ?ASSISTANT: Larene Pickett, PA-C ? ?PREOPERATIVE DIAGNOSIS: Displaced Left femoral neck fracture.  ? ?POSTOPERATIVE DIAGNOSIS: Displaced Left femoral neck fracture.  ? ?PROCEDURE: Left total hip arthroplasty, anterior approach.  ? ?IMPLANTS: Biomet Taperloc Reduced Distal stem, size 14 x 18m, standard offset. ?Biomet G7 OsseoTi Cup, size 52 mm. ?Biomet Vivacit-E liner, size 36 mm, E, neutral. ?Biomet metal head ball, size 36 + 0 mm. ? ?ANESTHESIA:  MAC and Spinal ? ?ANTIBIOTICS: 2g ancef. ? ?ESTIMATED BLOOD LOSS:-500 mL   ? ?DRAINS: None. ? ?COMPLICATIONS: None ?  ?CONDITION: PACU - hemodynamically stable.  ? ?BRIEF CLINICAL NOTE: Tina MCGATHis a 81y.o. female with a displaced Left femoral neck fracture. The patient was admitted to the hospitalist service and underwent perioperative risk stratification and medical optimization. The risks, benefits, and alternatives to total hip arthroplasty were explained, and the patient elected to proceed. ? ?PROCEDURE IN DETAIL: The patient was taken to the operating room and anesthesia was induced on the hospital bed.  The patient was then positioned on the Hana table.  All bony prominences were well padded.  The hip was prepped and draped in the normal sterile surgical fashion.  A time-out was called verifying side and site of surgery. Antibiotics were given within 60 minutes of beginning the procedure. ?  ?Bikini incision was made, and the direct anterior approach to the hip was performed through the Hueter interval.  Lateral femoral circumflex vessels were treated with the Auqumantys. The anterior capsule was exposed and an inverted T capsulotomy was made.  Fracture hematoma was encountered and evacuated. The patient was found to have a comminuted Left subcapital femoral neck fracture.  I freshened the femoral neck cut with a saw.  I removed the femoral neck fragment.  A corkscrew was placed into the head and the head  was removed.  This was passed to the back table and was measured. The pubofemoral ligament was released subperiosteally to the lesser trochanter. ? ?Acetabular exposure was achieved, and the pulvinar and labrum were excised. Sequential reaming of the acetabulum was then performed up to a size 51 mm reamer under direct visulization. A 52 mm cup was then opened and impacted into place at approximately 40 degrees of abduction and 20 degrees of anteversion. The final polyethylene liner was impacted into place and acetabular osteophytes were removed.  ?  ?I then gained femoral exposure taking care to protect the abductors and greater trochanter.  This was performed using standard external rotation, extension, and adduction.  A cookie cutter was used to enter the femoral canal, and then the femoral canal finder was placed.  Sequential broaching was performed up to a size 14.  Calcar planer was used on the femoral neck remnant.  I placed a standard offset neck and a trial head ball.  The hip was reduced.  Leg lengths and offset were checked fluoroscopically.  The hip was dislocated and trial components were removed.  The final implants were placed, and the hip was reduced.  Fluoroscopy was used to confirm component position and leg lengths.  At 90 degrees of external rotation and full extension, the hip was stable to an anterior directed force. ?  ?The wound was copiously irrigated with Irrisept solution and normal saline using pule lavage.  Marcaine solution was injected into the periarticular soft tissue.  The wound was closed in layers using #1 Stratafix for the fascia, 2-0 Vicryl for the subcutaneous fat, 2-0 Monocryl  for the deep dermal layer, 3-0 running Monocryl subcuticular stitch, and Dermabond for the skin.  Once the glue was fully dried, an Aquacell Ag dressing was applied.  The patient was transported to the recovery room in stable condition.  Sponge, needle, and instrument counts were correct at the end of the  case x2.  The patient tolerated the procedure well and there were no known complications. ? ?Please note that a surgical assistant was a medical necessity for this procedure to perform it in a safe and expeditious manner. Assistant was necessary to provide appropriate retraction of vital neurovascular structures, to prevent femoral fracture, and to allow for anatomic placement of the prosthesis.  ?

## 2021-06-09 DIAGNOSIS — D509 Iron deficiency anemia, unspecified: Secondary | ICD-10-CM

## 2021-06-09 DIAGNOSIS — R7989 Other specified abnormal findings of blood chemistry: Secondary | ICD-10-CM | POA: Diagnosis not present

## 2021-06-09 DIAGNOSIS — G9389 Other specified disorders of brain: Secondary | ICD-10-CM | POA: Diagnosis not present

## 2021-06-09 DIAGNOSIS — R55 Syncope and collapse: Secondary | ICD-10-CM | POA: Diagnosis not present

## 2021-06-09 DIAGNOSIS — S72002A Fracture of unspecified part of neck of left femur, initial encounter for closed fracture: Secondary | ICD-10-CM | POA: Diagnosis not present

## 2021-06-09 LAB — CBC WITH DIFFERENTIAL/PLATELET
Abs Immature Granulocytes: 0.16 10*3/uL — ABNORMAL HIGH (ref 0.00–0.07)
Basophils Absolute: 0 10*3/uL (ref 0.0–0.1)
Basophils Relative: 0 %
Eosinophils Absolute: 0.1 10*3/uL (ref 0.0–0.5)
Eosinophils Relative: 1 %
HCT: 25.2 % — ABNORMAL LOW (ref 36.0–46.0)
Hemoglobin: 8.2 g/dL — ABNORMAL LOW (ref 12.0–15.0)
Immature Granulocytes: 2 %
Lymphocytes Relative: 21 %
Lymphs Abs: 2.2 10*3/uL (ref 0.7–4.0)
MCH: 25.9 pg — ABNORMAL LOW (ref 26.0–34.0)
MCHC: 32.5 g/dL (ref 30.0–36.0)
MCV: 79.7 fL — ABNORMAL LOW (ref 80.0–100.0)
Monocytes Absolute: 0.9 10*3/uL (ref 0.1–1.0)
Monocytes Relative: 9 %
Neutro Abs: 7.1 10*3/uL (ref 1.7–7.7)
Neutrophils Relative %: 67 %
Platelets: 163 10*3/uL (ref 150–400)
RBC: 3.16 MIL/uL — ABNORMAL LOW (ref 3.87–5.11)
RDW: 13.9 % (ref 11.5–15.5)
WBC: 10.5 10*3/uL (ref 4.0–10.5)
nRBC: 0 % (ref 0.0–0.2)

## 2021-06-09 LAB — COMPREHENSIVE METABOLIC PANEL
ALT: 29 U/L (ref 0–44)
AST: 25 U/L (ref 15–41)
Albumin: 3.3 g/dL — ABNORMAL LOW (ref 3.5–5.0)
Alkaline Phosphatase: 76 U/L (ref 38–126)
Anion gap: 6 (ref 5–15)
BUN: 16 mg/dL (ref 8–23)
CO2: 23 mmol/L (ref 22–32)
Calcium: 8.4 mg/dL — ABNORMAL LOW (ref 8.9–10.3)
Chloride: 109 mmol/L (ref 98–111)
Creatinine, Ser: 0.88 mg/dL (ref 0.44–1.00)
GFR, Estimated: >60 mL/min (ref >60)
Glucose, Bld: 119 mg/dL — ABNORMAL HIGH (ref 70–99)
Potassium: 3.7 mmol/L (ref 3.5–5.1)
Sodium: 138 mmol/L (ref 135–145)
Total Bilirubin: 1 mg/dL (ref 0.3–1.2)
Total Protein: 5.3 g/dL — ABNORMAL LOW (ref 6.5–8.1)

## 2021-06-09 LAB — GLUCOSE, CAPILLARY: Glucose-Capillary: 141 mg/dL — ABNORMAL HIGH (ref 70–99)

## 2021-06-09 LAB — MAGNESIUM: Magnesium: 1.8 mg/dL (ref 1.7–2.4)

## 2021-06-09 NOTE — TOC Initial Note (Signed)
Transition of Care (TOC) - Initial/Assessment Note  ? ? ?Patient Details  ?Name: Tina Clements ?MRN: [id number] ?Date of Birth: 01/11/1941 ? ?Transition of Care (TOC) CM/SW Contact:    ?Tawanna Cooler, RN ?Phone Number: ?06/09/2021, 12:16 PM ? ?Clinical Narrative:                 ? ?Patient from home with spouse.  Usually ambulates with a walker.  May need SNF.  PT eval pending.   ?Did submit for PASRR, received number [id number].   ?TOC following.  ? ? ?Expected Discharge Plan: [place] ?Barriers to Discharge: Continued Medical Work up ? ? ? ?Expected Discharge Plan and Services ?Expected Discharge Plan: [place] ?  ?   ?Living arrangements for the past 2 months: [place] ?                ?  ?Prior Living Arrangements/Services ?Living arrangements for the past 2 months: [place] ?Lives with:: Spouse ?Patient language and need for interpreter reviewed:: Yes ?       ?Need for Family Participation in Patient Care: Yes (Comment) ?Care giver support system in place?: Yes (comment) ?Current home services: DME (walker) ?Criminal Activity/Legal Involvement Pertinent to Current Situation/Hospitalization: No - Comment as needed ? ?Activities of Daily Living ?Home Assistive Devices/Equipment: [place] Rile (specify type) ?ADL Screening (condition at time of admission) ?Patient's cognitive ability adequate to safely complete daily activities?: Yes ?Is the patient deaf or have difficulty hearing?: No ?Does the patient have difficulty seeing, even when wearing glasses/contacts?: No ?Does the patient have difficulty concentrating, remembering, or making decisions?: No ?Patient able to express need for assistance with ADLs?: No ?Does the patient have difficulty dressing or bathing?: No ?Independently performs ADLs?: Yes (appropriate for developmental age) ?Does the patient have difficulty walking or climbing stairs?: Yes ?Weakness of Legs: Both ?Weakness of Arms/Hands:  None ? ?Emotional Assessment ? ?Orientation: : Oriented to Self, Oriented to Place, Oriented to  Time, Oriented to Situation ?Alcohol / Substance Use: Not Applicable ?Psych Involvement: No (comment) ? ?Admission diagnosis:  Hip fracture ([place]) [S72.009A] ?Elevated blood pressure reading [R03.0] ?Elevated liver function tests [R79.89] ?Accidental fall, initial encounter [W19.XXXA] ?Acquired cerebral ventriculomegaly ([place]) [G91.1] ?Closed traumatic nondisplaced fracture of neck of left femur, initial encounter ([place]) [S72.002A] ?Patient Active Problem List  ? Diagnosis Date Noted  ? Microcytic anemia 06/09/2021  ? HTN (hypertension) 06/07/2021  ? Syncope and collapse 06/07/2021  ? Abnormal LFTs 06/07/2021  ? Cerebral ventriculomegaly 06/07/2021  ? Hip fracture ([place]) 06/06/2021  ? OA (osteoarthritis) of knee 04/25/2019  ? S/P total knee arthroplasty, right 04/25/2019  ? History of osteopenia - dx'd 2010 01/21/2012  ? History of vitamin D deficiency 01/21/2012  ? Hyperlipemia   ? Hyperlipidemia   ? Sarcoidosis   ? ?PCP:  [name], MD ?Pharmacy:   ?[facility] [place], [address] AT [facility] ?[place] [address] ?Phone: [phone] Fax: [phone] ? ? ?

## 2021-06-09 NOTE — Progress Notes (Signed)
?PROGRESS NOTE ? ? ? ?Tina Clements  LXB:[id number] DOB: June 01, 1940 DOA: 06/06/2021 ?PCP: [name], MD  ? ?Brief Narrative:  ? 81 y.o. female with medical history significant of  hyperlipidemia,OA, and osteopenia presented with syncope with loss of consciousness and fall.  On presentation, AST was 54, ALT of 63.  Hip x-ray showed impacted left subcapital femoral neck fracture.  Orthopedics was consulted.  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  She underwent surgical intervention on 06/08/2021 ? ?Assessment & Plan: ?  ?Left subcapital femoral neck fracture following a fall ?-Status post left total hip arthroplasty on 06/08/2021.  DVT prophylaxis/wound care as per orthopedics recommendations. ?-Pain management.  Fall precautions ?-PT/OT eval. ? ?Syncope and collapse ?-Unclear etiology.  Continue telemetry monitoring.  Echo showed EF of 60 to 65% with grade 1 diastolic dysfunction. ?-PT eval ? ?Cerebral ventriculomegaly ?-Most likely chronic. ?-Baseline she ambulates with walker, no cognitive changes. ?-Will benefit from outpatient neurology evaluation ? ?Abnormal LFTs ?-Questionable cause.  Resolved. ? ?Microcytic anemia ?-Questionable cause.  Hemoglobin slightly dropped compared to yesterday.  Monitor ? ?DVT prophylaxis: Aspirin 81 mg twice a day  ?code Status: Full ?Family Communication: Husband at bedside on 06/07/2021 ?Disposition Plan: ?Status is: Inpatient ?Remains inpatient appropriate because: Of severity of illness.  PT eval pending. ? ? ? ?Consultants: Orthopedics ? ?Procedures: Left total hip arthroplasty on 06/08/2021 ?Antimicrobials: Perioperative ? ?Subjective: ?Patient seen and examined at bedside.  Complains of intermittent left hip pain.  No fever, vomiting, worsening shortness of breath reported. ?Objective: ?Vitals:  ? 06/08/21 1141 06/08/21 2046 06/09/21 0442 06/09/21 0446  ?BP: (!) 120/56 131/64 (!) 114/56   ?Pulse: (!) 56 70 78   ?Resp: '16 18 17    '$ ?Temp: 97.7 ?F (36.5 ?C) 98.6 ?F (37 ?C) 100.2 ?F (37.9 ?C)   ?TempSrc: Oral Oral Oral   ?SpO2: 100% 100% 98%   ?Weight:    76.3 kg  ?Height:      ? ? ?Intake/Output Summary (Last 24 hours) at 06/09/2021 0738 ?Last data filed at 06/08/2021 1700 ?Gross per 24 hour  ?Intake 1950 ml  ?Output 1650 ml  ?Net 300 ml  ? ? ?Filed Weights  ? 06/07/21 1431 06/08/21 0500 06/09/21 0446  ?Weight: 73.9 kg 73 kg 76.3 kg  ? ? ?Examination: ? ?General: No acute distress.  Currently on room air. ?respiratory: Bilateral decreased breath sounds at bases with scattered crackles CVS: S1-S2 heard; mild intermittent bradycardia present  ?abdominal: Soft, nontender, distended slightly; no organomegaly; bowel sounds are heard  ?extremities: No cyanosis; mild lower extremity edema present ? ? ? ?Data Reviewed: I have personally reviewed following labs and imaging studies ? ?CBC: ?Recent Labs  ?Lab 06/06/21 ?2053 06/07/21 ?4163 06/09/21 ?0319  ?WBC 9.0 7.0 10.5  ?NEUTROABS 5.3  --  7.1  ?HGB 11.5* 10.3* 8.2*  ?HCT 36.1 31.5* 25.2*  ?MCV 80.6 78.9* 79.7*  ?PLT 180 165 163  ? ? ?Basic Metabolic Panel: ?Recent Labs  ?Lab 06/06/21 ?2053 06/07/21 ?8453 06/09/21 ?0319  ?NA 140 141 138  ?K 3.8 3.5 3.7  ?CL 108 109 109  ?CO2 '24 25 23  '$ ?GLUCOSE 114* 112* 119*  ?BUN '15 13 16  '$ ?CREATININE 0.87 0.78 0.88  ?CALCIUM 9.3 9.0 8.4*  ?MG  --   --  1.8  ? ? ?GFR: ?Estimated Creatinine Clearance: 52.3 mL/min (by C-G formula based on SCr of 0.88 mg/dL). ?Liver Function Tests: ?Recent Labs  ?Lab 06/06/21 ?2053 06/07/21 ?6468 06/09/21 ?  0319  ?AST 54* 34 25  ?ALT 63* 49* 29  ?ALKPHOS 116 100 76  ?BILITOT 1.2 1.4* 1.0  ?PROT 7.2 6.1* 5.3*  ?ALBUMIN 4.3 3.6 3.3*  ? ? ?No results for input(s): LIPASE, AMYLASE in the last 168 hours. ?No results for input(s): AMMONIA in the last 168 hours. ?Coagulation Profile: ?No results for input(s): INR, PROTIME in the last 168 hours. ?Cardiac Enzymes: ?No results for input(s): CKTOTAL, CKMB, CKMBINDEX, TROPONINI in the last 168  hours. ?BNP (last 3 results) ?No results for input(s): PROBNP in the last 8760 hours. ?HbA1C: ?No results for input(s): HGBA1C in the last 72 hours. ?CBG: ?Recent Labs  ?Lab 06/06/21 ?1814 06/08/21 ?3016 06/09/21 ?0109  ?GLUCAP 121* 118* 141*  ? ? ?Lipid Profile: ?No results for input(s): CHOL, HDL, LDLCALC, TRIG, CHOLHDL, LDLDIRECT in the last 72 hours. ?Thyroid Function Tests: ?No results for input(s): TSH, T4TOTAL, FREET4, T3FREE, THYROIDAB in the last 72 hours. ?Anemia Panel: ?No results for input(s): VITAMINB12, FOLATE, FERRITIN, TIBC, IRON, RETICCTPCT in the last 72 hours. ?Sepsis Labs: ?No results for input(s): PROCALCITON, LATICACIDVEN in the last 168 hours. ? ?No results found for this or any previous visit (from the past 240 hour(s)).  ? ? ? ? ? ?Radiology Studies: ?DG Pelvis Portable ? ?Result Date: 06/08/2021 ?CLINICAL DATA:  Left hip arthroplasty EXAM: PORTABLE PELVIS 1-2 VIEWS COMPARISON:  06/07/2021 FINDINGS: Two C-arm fluoroscopic images were obtained intraoperatively and submitted for post operative interpretation. Left total hip arthroplasty hardware in place a and appropriately aligned. 9 seconds fluoroscopy time. Radiation dose: 1.49 mGy. Please see the performing provider's procedural report for further detail. IMPRESSION: As above. Electronically Signed   By: Davina Poke D.O.   On: 06/08/2021 11:13  ? ?CT HIP LEFT WO CONTRAST ? ?Result Date: 06/07/2021 ?CLINICAL DATA:  Left hip fracture. EXAM: CT OF THE LEFT HIP WITHOUT CONTRAST TECHNIQUE: Multidetector CT imaging of the left hip was performed according to the standard protocol. Multiplanar CT image reconstructions were also generated. RADIATION DOSE REDUCTION: This exam was performed according to the departmental dose-optimization program which includes automated exposure control, adjustment of the mA and/or kV according to patient size and/or use of iterative reconstruction technique. COMPARISON:  Left hip x-rays from yesterday. FINDINGS:  Bones/Joint/Cartilage Unchanged acute mildly impacted subcapital left femoral neck fracture. No additional fracture. No dislocation. Mild bilateral hip osteoarthritis. Osteopenia. Small left hip joint effusion. Degenerative changes of the pubic symphysis and sacroiliac joints. Severe lower lumbar degenerative disc disease. Advanced lower lumbar facet arthropathy with grade 1 anterolisthesis at L5-S1. Ligaments Ligaments are suboptimally evaluated by CT. Muscles and Tendons Grossly intact.  No muscle atrophy. Soft tissue No fluid collection or hematoma.  No soft tissue mass. Sigmoid colonic diverticulosis. Aortoiliac atherosclerotic vascular disease. IMPRESSION: 1. Unchanged acute mildly impacted subcapital left femoral neck fracture. Electronically Signed   By: Titus Dubin M.D.   On: 06/07/2021 09:48  ? ?DG Knee Left Port ? ?Result Date: 06/07/2021 ?CLINICAL DATA:  81 year old female with history femoral fracture. EXAM: PORTABLE LEFT KNEE - 1-2 VIEW COMPARISON:  01/05/2009 FINDINGS: No acute fracture or malalignment. Moderate lateral compartment and mild medial compartment joint space narrowing with associated subchondral sclerosis and mild periarticular osteophyte formation. Mild quadriceps patellar enthesopathy. Soft tissues are within normal limits. IMPRESSION: 1. No acute fracture or malalignment. 2. Moderate tricompartmental degenerative changes, most pronounced about the lateral compartment. Electronically Signed   By: Ruthann Cancer M.D.   On: 06/07/2021 08:44  ? ?DG C-Arm 1-60 Min-No Report ? ?  Result Date: 06/08/2021 ?Fluoroscopy was utilized by the requesting physician.  No radiographic interpretation.  ? ?DG C-Arm 1-60 Min-No Report ? ?Result Date: 06/08/2021 ?Fluoroscopy was utilized by the requesting physician.  No radiographic interpretation.  ? ?ECHOCARDIOGRAM COMPLETE ? ?Result Date: 06/07/2021 ?   ECHOCARDIOGRAM REPORT   Patient Name:   Tina Clements Date of Exam: 06/07/2021 Medical Rec #:   [id number]         Height:       66.5 in Accession #:    [id number]        Weight:       160.0 lb Date of Birth:  08-11-1940          BSA:          1.829 m? Patient Age:    73 years          BP:           150/85 mmHg Patien

## 2021-06-09 NOTE — Evaluation (Signed)
Occupational Therapy Evaluation ?Patient Details ?Name: Tina Clements ?MRN: [id number] ?DOB: 1940/09/05 ?Today's Date: 06/09/2021 ? ? ?History of Present Illness Tina Clements is a 81 y.o. female with a past medical history of osteoarthritis, osteopenia, and hyperlipidemia who frequently gets dizzy.  She had a ground-level fall after she got dizzy at home on 06/03/2021.  She had hip pain and inability to weight-bear.  When she did not get any better, she came to the emergency department at [facility].  X-rays and CT scan of the left hip revealed a comminuted, impacted, mildly retroverted subcapital femoral neck fracture. Pt is s/p L THA anterior approach.  ? ?Clinical Impression ?  ?Tina Clements is an 81 year old woman s/p anterior hip arthroplasty with decreased Rom and strength of LLE and pain. She demonstrates ability to perform functional mobility well with RW and is mostly independent with ADLs -  needing assistance with LB dressing (socks and underwear). Expect she will not need assistance for very long once acute surgical pain has improved. She has assistance of her husband at home, is able to live downstairs and she mentioned having a BSC in her attic. From a functional stand ponit she is doing well and do not expect for her to need OT at discharge. Will follow acutely and will need to educate husband in regards to how much assistance she will need at home. Overall her cognition is functional - she is alert to self, month, date, year. She is able to answer questions appropriately. She did say 1-2 questionable things - so want to make sure husband is educated as well just in case patient has some mild short term memory deficits.  ?   ? ?Recommendations for follow up therapy are one component of a multi-disciplinary discharge planning process, led by the attending physician.  Recommendations may be updated based on patient status, additional functional criteria and insurance authorization.   ? ?Follow Up Recommendations ? No OT follow up  ?  ?Assistance Recommended at Discharge Intermittent Supervision/Assistance  ?Patient can return home with the following A little help with bathing/dressing/bathroom;Assistance with cooking/housework;Assist for transportation;Help with stairs or ramp for entrance ? ?  ?Functional Status Assessment ? Patient has had a recent decline in their functional status and demonstrates the ability to make significant improvements in function in a reasonable and predictable amount of time.  ?Equipment Recommendations ? None recommended by OT  ?  ?Recommendations for Other Services   ? ? ?  ?Precautions / Restrictions Precautions ?Precautions: None ?Restrictions ?Weight Bearing Restrictions: No ?LLE Weight Bearing: Weight bearing as tolerated  ? ?  ? ?Mobility Bed Mobility ?  ?  ?  ?  ?  ?  ?  ?General bed mobility comments: up in recliner ?  ? ?Transfers ?Overall transfer level: Needs assistance ?Equipment used: Rolling walker (2 wheels) ?Transfers: Sit to/from Stand ?Sit to Stand: Min guard ?  ?  ?  ?  ?  ?General transfer comment: Min guard for ambulation with walker ?  ? ?  ?Balance Overall balance assessment: Mild deficits observed, not formally tested ?  ?  ?  ?  ?  ?  ?  ?  ?  ?  ?  ?  ?  ?  ?  ?  ?  ?  ?   ? ?ADL either performed or assessed with clinical judgement  ? ?ADL Overall ADL's : Needs assistance/impaired ?Eating/Feeding: Independent ?  ?Grooming: Independent;Standing ?Grooming Details (indicate cue  type and reason): at sink ?Upper Body Bathing: Independent ?  ?Lower Body Bathing: Minimal assistance ?  ?Upper Body Dressing : Independent ?  ?Lower Body Dressing: Minimal assistance ?  ?Toilet Transfer: Supervision/safety ?  ?Toileting- Clothing Manipulation and Hygiene: Supervision/safety ?  ?  ?  ?Functional mobility during ADLs: Min guard;Rolling walker (2 wheels) ?   ? ? ? ?Vision Patient Visual Report: No change from baseline ?   ?   ?Perception   ?  ?Praxis   ?   ? ?Pertinent Vitals/Pain Pain Assessment ?Pain Assessment: Faces ?Faces Pain Scale: Hurts even more ?Pain Location: L surgical area ?Pain Descriptors / Indicators: Sore, Grimacing ?Pain Intervention(s): Limited activity within patient's tolerance  ? ? ? ?Hand Dominance Right ?  ?Extremity/Trunk Assessment Upper Extremity Assessment ?Upper Extremity Assessment: Overall WFL for tasks assessed ?  ?Lower Extremity Assessment ?Lower Extremity Assessment: Defer to PT evaluation ?  ?Cervical / Trunk Assessment ?Cervical / Trunk Assessment: Normal ?  ?Communication Communication ?Communication: Expressive difficulties (some occasional word finding difficulty which she states is new.) ?  ?Cognition Arousal/Alertness: Awake/alert ?Behavior During Therapy: [facility] for tasks assessed/performed ?Overall Cognitive Status: Within Functional Limits for tasks assessed ?  ?  ?  ?  ?  ?  ?  ?  ?  ?  ?  ?  ?  ?  ?  ?  ?  ?  ?  ?General Comments    ? ?  ?Exercises   ?  ?Shoulder Instructions    ? ? ?Home Living Family/patient expects to be discharged to:: Private residence ?Living Arrangements: Spouse/significant other ?Available Help at Discharge: Family;Available 24 hours/day ?Type of Home: House ?Home Access: Stairs to enter ?Entrance Stairs-Number of Steps: 2 ?Entrance Stairs-Rails: None ?Home Layout: Two level;1/2 bath on main level ?Alternate Level Stairs-Number of Steps: flight ?  ?Bathroom Shower/Tub: Walk-in shower (threshold to step over) ?  ?Bathroom Toilet: Handicapped height ?  ?  ?Home Equipment: Conservation officer, nature (2 wheels) ?  ?  ?  ? ?  ?Prior Functioning/Environment Prior Level of Function : Independent/Modified Independent ?  ?  ?  ?  ?  ?  ?Mobility Comments: Independent since fall which was 6 days ago. Went to the ER several days later. ?  ?  ? ?  ?  ?OT Problem List: Pain;Decreased knowledge of use of DME or AE ?  ?   ?OT Treatment/Interventions: Self-care/ADL training;DME and/or AE instruction;Therapeutic activities  ?   ?OT Goals(Current goals can be found in the care plan section) Acute Rehab OT Goals ?OT Goal Formulation: All assessment and education complete, DC therapy  ?OT Frequency: Min 2X/week ?  ? ?Co-evaluation   ?  ?  ?  ?  ? ?  ?AM-PAC OT "6 Clicks" Daily Activity     ?Outcome Measure Help from another person eating meals?: None ?Help from another person taking care of personal grooming?: None ?Help from another person toileting, which includes using toliet, bedpan, or urinal?: A Little ?Help from another person bathing (including washing, rinsing, drying)?: A Little ?Help from another person to put on and taking off regular upper body clothing?: None ?Help from another person to put on and taking off regular lower body clothing?: A Little ?6 Click Score: 21 ?  ?End of Session Equipment Utilized During Treatment: Rolling walker (2 wheels) ?Nurse Communication: Mobility status ? ?Activity Tolerance: Patient tolerated treatment well ?Patient left: in chair;with call bell/phone within reach;with chair alarm set ? ?OT Visit Diagnosis: Pain ?  Pain - Right/Left: Left ?Pain - part of body: Hip  ?              ?Time: 3013-1438 ?OT Time Calculation (min): 19 min ?Charges:  OT General Charges ?$OT Visit: 1 Visit ?OT Evaluation ?$OT Eval Low Complexity: 1 Low ? ?Tina Clements, Tina Clements ?Acute Care Rehab Services  ?Office [phone] ?Pager: [phone]  ? ?Tina Clements ?06/09/2021, 1:05 PM ?

## 2021-06-09 NOTE — Evaluation (Signed)
Physical Therapy Evaluation ?Patient Details ?Name: Tina Clements ?MRN: [id number] ?DOB: 23-Feb-1940 ?Today's Date: 06/09/2021 ? ?History of Present Illness ? Tina Clements is a 81 y.o. female with a past medical history of osteoarthritis, osteopenia, and hyperlipidemia who frequently gets dizzy.  She had a ground-level fall after she got dizzy at home on 06/03/2021.  She had hip pain and inability to weight-bear.  When she did not get any better, she came to the emergency department at [facility].  X-rays and CT scan of the left hip revealed a comminuted, impacted, mildly retroverted subcapital femoral neck fracture. Pt is s/p L THA anterior approach.  ?Clinical Impression ? Pt admitted with above diagnosis.  Pt currently with functional limitations due to the deficits listed below (see PT Problem List). Pt will benefit from skilled PT to increase their independence and safety with mobility to allow discharge to the venue listed below.  Recommend HHPT. ?   ?   ? ?Recommendations for follow up therapy are one component of a multi-disciplinary discharge planning process, led by the attending physician.  Recommendations may be updated based on patient status, additional functional criteria and insurance authorization. ? ?Follow Up Recommendations Home health PT ? ?  ?Assistance Recommended at Discharge PRN  ?Patient can return home with the following ? A little help with walking and/or transfers;Help with stairs or ramp for entrance ? ?  ?Equipment Recommendations None recommended by PT  ?Recommendations for Other Services ?    ?  ?Functional Status Assessment Patient has had a recent decline in their functional status and demonstrates the ability to make significant improvements in function in a reasonable and predictable amount of time.  ? ?  ?Precautions / Restrictions Precautions ?Precautions: None ?Restrictions ?Weight Bearing Restrictions: No ?LLE Weight Bearing: Weight bearing as tolerated  ? ?   ? ?Mobility ? Bed Mobility ?Overal bed mobility: Needs Assistance ?Bed Mobility: Supine to Sit ?  ?  ?Supine to sit: Min assist ?  ?  ?General bed mobility comments: MIN A at end of transer, but she was able to do the first part by herself and just needed a little A with LE and repositioning. ?  ? ?Transfers ?Overall transfer level: Needs assistance ?Equipment used: Rolling walker (2 wheels) ?Transfers: Sit to/from Stand ?Sit to Stand: Min guard, From elevated surface ?  ?  ?  ?  ?  ?General transfer comment: min/guard with cues ?  ? ?Ambulation/Gait ?Ambulation/Gait assistance: Min guard ?Gait Distance (Feet): 90 Feet ?Assistive device: Rolling walker (2 wheels) ?Gait Pattern/deviations: Step-through pattern, Decreased step length - right ?Gait velocity: decreased ?  ?  ?General Gait Details: cues for sequencing and positioning within the RW ? ?Stairs ?  ?  ?  ?  ?  ? ?Wheelchair Mobility ?  ? ?Modified Rankin (Stroke Patients Only) ?  ? ?  ? ?Balance Overall balance assessment: Needs assistance ?  ?Sitting balance-Leahy Scale: Good ?  ?  ?Standing balance support: Reliant on assistive device for balance ?Standing balance-Leahy Scale: Poor ?  ?  ?  ?  ?  ?  ?  ?  ?  ?  ?  ?  ?   ? ? ? ?Pertinent Vitals/Pain Pain Assessment ?Pain Assessment: 0-10 ?Pain Score: 6  ?Pain Location: L surgical area ?Pain Descriptors / Indicators: Sore, Grimacing ?Pain Intervention(s): Premedicated before session, Ice applied  ? ? ?Home Living Family/patient expects to be discharged to:: Private residence ?Living Arrangements: Spouse/significant other ?Available  Help at Discharge: Family;Available 24 hours/day ?Type of Home: House ?Home Access: Stairs to enter ?Entrance Stairs-Rails: None ?Entrance Stairs-Number of Steps: 2 ?Alternate Level Stairs-Number of Steps: flight ?Home Layout: Two level;1/2 bath on main level ?Home Equipment: Conservation officer, nature (2 wheels) ?   ?  ?Prior Function Prior Level of Function : Independent/Modified  Independent ?  ?  ?  ?  ?  ?  ?Mobility Comments: Independent since fall which was 6 days ago. Went to the ER several days later. ?  ?  ? ? ?Hand Dominance  ? Dominant Hand: Right ? ?  ?Extremity/Trunk Assessment  ? Upper Extremity Assessment ?Upper Extremity Assessment: Defer to OT evaluation ?  ? ?Lower Extremity Assessment ?Lower Extremity Assessment: LLE deficits/detail ?LLE Deficits / Details: L hip/knee to ~45-50 degrees in supine. ?  ? ?Cervical / Trunk Assessment ?Cervical / Trunk Assessment: Normal  ?Communication  ? Communication: Expressive difficulties (some occasional word finding difficulty which she states is new.)  ?Cognition Arousal/Alertness: Awake/alert ?Behavior During Therapy: [facility] for tasks assessed/performed ?Overall Cognitive Status: No family/caregiver present to determine baseline cognitive functioning ?  ?  ?  ?  ?  ?  ?  ?  ?  ?  ?  ?  ?  ?  ?  ?  ?General Comments: Couldn't name the President and had trouble with some questions regarding home set-up and had to clarify. ?  ?  ? ?  ?General Comments   ? ?  ?Exercises Total Joint Exercises ?Ankle Circles/Pumps: AROM, Left, 10 reps ?Heel Slides: Left, 10 reps, Supine, AROM ?Hip ABduction/ADduction: AROM, Supine, Left  ? ?Assessment/Plan  ?  ?PT Assessment Patient needs continued PT services  ?PT Problem List Decreased strength;Decreased activity tolerance;Decreased mobility ? ?   ?  ?PT Treatment Interventions DME instruction;Gait training;Functional mobility training;Therapeutic exercise;Therapeutic activities;Balance training   ? ?PT Goals (Current goals can be found in the Care Plan section)  ?Acute Rehab PT Goals ?Patient Stated Goal: go home ?PT Goal Formulation: With patient ?Time For Goal Achievement: 06/23/21 ?Potential to Achieve Goals: Good ? ?  ?Frequency Min 6X/week ?  ? ? ?Co-evaluation   ?  ?  ?  ?  ? ? ?  ?AM-PAC PT "6 Clicks" Mobility  ?Outcome Measure Help needed turning from your back to your side while in a flat bed without  using bedrails?: A Little ?Help needed moving from lying on your back to sitting on the side of a flat bed without using bedrails?: A Little ?Help needed moving to and from a bed to a chair (including a wheelchair)?: A Little ?Help needed standing up from a chair using your arms (e.g., wheelchair or bedside chair)?: A Little ?Help needed to walk in hospital room?: A Little ?Help needed climbing 3-5 steps with a railing? : A Little ?6 Click Score: 18 ? ?  ?End of Session Equipment Utilized During Treatment: Gait belt ?Activity Tolerance: Patient tolerated treatment well ?Patient left: in chair;with call bell/phone within reach;with chair alarm set ?Nurse Communication: Mobility status ?PT Visit Diagnosis: Muscle weakness (generalized) (M62.81);Difficulty in walking, not elsewhere classified (R26.2) ?  ? ?Time: 1791-5056 ?PT Time Calculation (min) (ACUTE ONLY): 45 min ? ? ?Charges:   PT Evaluation ?$PT Eval Moderate Complexity: 1 Mod ?PT Treatments ?$Gait Training: 8-22 mins ?$Therapeutic Exercise: 8-22 mins ?  ?   ? ? ?Santiago Glad L. Tamala Julian, PT ? ?06/09/2021 ? ? ?Galen Manila ?06/09/2021, 1:42 PM ? ?

## 2021-06-09 NOTE — Progress Notes (Signed)
The pt dangled at the side of the bed, she then ambulated 25 ft with the walker and  two staff  members in the hallway and tolerated the activity well. ?

## 2021-06-09 NOTE — Progress Notes (Signed)
Subjective: ?1 Day Post-Op Procedure(s) (LRB): ?TOTAL HIP ARTHROPLASTY ANTERIOR APPROACH (Left) ? ?Patient reports pain as mild to moderate.  Tolerating POs well.  Admits to flatus.  Denies fever, chills, N/V, CP, SOB.  Notes that she was able to walk last night after surgery.  Resting comfortably in bed. ? ?Objective:  ? ?VITALS:  Temp:  [97 ?F (36.1 ?C)-100.2 ?F (37.9 ?C)] 100.2 ?F (37.9 ?C) (04/23 6045) ?Pulse Rate:  [51-78] 78 (04/23 0442) ?Resp:  [15-18] 17 (04/23 0442) ?BP: (107-131)/(55-79) 114/56 (04/23 0442) ?SpO2:  [97 %-100 %] 98 % (04/23 0442) ?Weight:  [76.3 kg] 76.3 kg (04/23 0446) ? ?General: WDWN patient in NAD. ?Psych:  Appropriate mood and affect. ?Neuro:  A&O x 3, Moving all extremities, sensation intact to light touch ?HEENT:  EOMs intact ?Chest:  Even non-labored respirations ?Skin:  Dressing C/D/I, no rashes or lesions ?Extremities: warm/dry, mild edema to L hip, no erythema or echymosis.  No lymphadenopathy. ?Pulses: Popliteus 2+ ?MSK:  ROM: TKE, MMT: able to perform quad set, (-) Homan's  ? ? ?LABS ?Recent Labs  ?  06/06/21 ?2053 06/07/21 ?4098 06/09/21 ?0319  ?HGB 11.5* 10.3* 8.2*  ?WBC 9.0 7.0 10.5  ?PLT 180 165 163  ? ?Recent Labs  ?  06/07/21 ?1191 06/09/21 ?0319  ?NA 141 138  ?K 3.5 3.7  ?CL 109 109  ?CO2 25 23  ?BUN 13 16  ?CREATININE 0.78 0.88  ?GLUCOSE 112* 119*  ? ?No results for input(s): LABPT, INR in the last 72 hours. ? ? ?Assessment/Plan: ?1 Day Post-Op Procedure(s) (LRB): ?TOTAL HIP ARTHROPLASTY ANTERIOR APPROACH (Left) ? ?Patient seen in rounds for Dr. Lyla Glassing ?WBAT L LE with walker ?Up with therapy ?DVT ppx:  ASA ?Plan for outpatient post-op visit with Dr. Lyla Glassing. ? ?Mechele Claude PA-C ?[facility] ?Office:  [phone]  ?

## 2021-06-10 ENCOUNTER — Encounter (HOSPITAL_COMMUNITY): Payer: Self-pay | Admitting: Orthopedic Surgery

## 2021-06-10 DIAGNOSIS — R7989 Other specified abnormal findings of blood chemistry: Secondary | ICD-10-CM | POA: Diagnosis not present

## 2021-06-10 DIAGNOSIS — D509 Iron deficiency anemia, unspecified: Secondary | ICD-10-CM

## 2021-06-10 DIAGNOSIS — G9389 Other specified disorders of brain: Secondary | ICD-10-CM | POA: Diagnosis not present

## 2021-06-10 DIAGNOSIS — S72002A Fracture of unspecified part of neck of left femur, initial encounter for closed fracture: Secondary | ICD-10-CM | POA: Diagnosis not present

## 2021-06-10 LAB — CBC WITH DIFFERENTIAL/PLATELET
Abs Immature Granulocytes: 0.11 10*3/uL — ABNORMAL HIGH (ref 0.00–0.07)
Basophils Absolute: 0 10*3/uL (ref 0.0–0.1)
Basophils Relative: 0 %
Eosinophils Absolute: 0.1 10*3/uL (ref 0.0–0.5)
Eosinophils Relative: 1 %
HCT: 25 % — ABNORMAL LOW (ref 36.0–46.0)
Hemoglobin: 8.1 g/dL — ABNORMAL LOW (ref 12.0–15.0)
Immature Granulocytes: 1 %
Lymphocytes Relative: 17 %
Lymphs Abs: 2.2 10*3/uL (ref 0.7–4.0)
MCH: 25.9 pg — ABNORMAL LOW (ref 26.0–34.0)
MCHC: 32.4 g/dL (ref 30.0–36.0)
MCV: 79.9 fL — ABNORMAL LOW (ref 80.0–100.0)
Monocytes Absolute: 1.2 10*3/uL — ABNORMAL HIGH (ref 0.1–1.0)
Monocytes Relative: 9 %
Neutro Abs: 9.5 10*3/uL — ABNORMAL HIGH (ref 1.7–7.7)
Neutrophils Relative %: 72 %
Platelets: 172 10*3/uL (ref 150–400)
RBC: 3.13 MIL/uL — ABNORMAL LOW (ref 3.87–5.11)
RDW: 14.1 % (ref 11.5–15.5)
WBC: 13.2 10*3/uL — ABNORMAL HIGH (ref 4.0–10.5)
nRBC: 0 % (ref 0.0–0.2)

## 2021-06-10 LAB — BASIC METABOLIC PANEL
Anion gap: 5 (ref 5–15)
BUN: 14 mg/dL (ref 8–23)
CO2: 26 mmol/L (ref 22–32)
Calcium: 8.5 mg/dL — ABNORMAL LOW (ref 8.9–10.3)
Chloride: 108 mmol/L (ref 98–111)
Creatinine, Ser: 0.71 mg/dL (ref 0.44–1.00)
GFR, Estimated: >60 mL/min (ref >60)
Glucose, Bld: 113 mg/dL — ABNORMAL HIGH (ref 70–99)
Potassium: 3.8 mmol/L (ref 3.5–5.1)
Sodium: 139 mmol/L (ref 135–145)

## 2021-06-10 LAB — GLUCOSE, CAPILLARY: Glucose-Capillary: 112 mg/dL — ABNORMAL HIGH (ref 70–99)

## 2021-06-10 LAB — MAGNESIUM: Magnesium: 2.2 mg/dL (ref 1.7–2.4)

## 2021-06-10 MED ORDER — ASPIRIN 81 MG PO CHEW
81.0000 mg | CHEWABLE_TABLET | Freq: Two times a day (BID) | ORAL | 0 refills | Status: AC
Start: 2021-06-10 — End: 2021-07-25

## 2021-06-10 MED ORDER — SENNOSIDES-DOCUSATE SODIUM 8.6-50 MG PO TABS: 1.0000 | ORAL_TABLET | Freq: Two times a day (BID) | ORAL | 0 refills | Status: AC

## 2021-06-10 MED ORDER — METHOCARBAMOL 500 MG PO TABS: 500.0000 mg | ORAL_TABLET | Freq: Four times a day (QID) | ORAL | 0 refills | Status: AC | PRN

## 2021-06-10 MED ORDER — HYDROCODONE-ACETAMINOPHEN 10-325 MG PO TABS: 0.5000 | ORAL_TABLET | ORAL | 0 refills | Status: AC | PRN

## 2021-06-10 MED ORDER — POLYETHYLENE GLYCOL 3350 17 G PO PACK: 17.0000 g | PACK | Freq: Every day | ORAL | 0 refills | Status: AC | PRN

## 2021-06-10 NOTE — TOC Progression Note (Signed)
Transition of Care (TOC) - Progression Note  ? ? ?Patient Details  ?Name: Tina Clements ?MRN: [id number] ?Date of Birth: 1941-02-14 ? ?Transition of Care (TOC) CM/SW Contact  ?Purcell Mouton, RN ?Phone Number: ?06/10/2021, 10:04 AM ? ?Clinical Narrative:    ?Spoke with Pt concerning HHPT. Landry Corporal was selected for HHPT.  ? ? ?Expected Discharge Plan: [place] ?Barriers to Discharge: Continued Medical Work up ? ?Expected Discharge Plan and Services ?Expected Discharge Plan: [place] ?  ?  ?  ?Living arrangements for the past 2 months: [place] ?Expected Discharge Date: 06/10/21               ?  ?  ?  ?  ?  ?  ?  ?  ?  ?  ? ? ?Social Determinants of Health (SDOH) Interventions ?  ? ?Readmission Risk Interventions ?   ? View : No data to display.  ?  ?  ?  ? ? ?

## 2021-06-10 NOTE — Discharge Summary (Signed)
Physician Discharge Summary  ?Tina Clements IRW:[id number] DOB: 06/01/1940 DOA: 06/06/2021 ? ?PCP: [name], MD ? ?Admit date: 06/06/2021 ?Discharge date: 06/10/2021 ? ?Admitted From: Home ?Disposition: Home ? ?Recommendations for Outpatient Follow-up:  ?Follow up with PCP in 1 week with repeat CBC/BMP ?Outpatient follow-up with orthopedics.  Discharge wound care/pain management/DVT prophylaxis as per orthopedics recommendations ?Follow up in ED if symptoms worsen or new appear ? ? ?Home Health: Home health PT ?Equipment/Devices: None ? ?Discharge Condition: Stable ?CODE STATUS: Full ?Diet recommendation: Heart healthy ? ?Brief/Interim Summary: ?81 y.o. female with medical history significant of  hyperlipidemia,OA, and osteopenia presented with syncope with loss of consciousness and fall.  On presentation, AST was 54, ALT of 63.  Hip x-ray showed impacted left subcapital femoral neck fracture.  Orthopedics was consulted.  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  She underwent surgical intervention on 06/08/2021.  Subsequently she has tolerated physical therapy.  Orthopedics has cleared the patient for discharge.  She will be discharged home with home health PT today.  Outpatient follow-up with orthopedics. ? ?Discharge Diagnoses:  ? ?Left subcapital femoral neck fracture following a fall ?-Status post left total hip arthroplasty on 06/08/2021.  Discharge pain management/DVT prophylaxis/wound care as per orthopedics recommendations. ?-Subsequently she has tolerated physical therapy.  Orthopedics has cleared the patient for discharge.  She will be discharged home with home health PT today.  Outpatient follow-up with orthopedics. ?  ?Syncope and collapse ?-Unclear etiology.  Continue telemetry monitoring.  Echo showed EF of 60 to 65% with grade 1 diastolic dysfunction. ?-No further syncope since admission. ?  ?Cerebral ventriculomegaly ?-Most likely chronic. ?-Baseline she  ambulates with walker, no cognitive changes. ?-Will benefit from outpatient neurology referral by PCP. ?  ?Abnormal LFTs ?-Questionable cause.  Resolved. ? ?Microcytic anemia ?-Questionable cause.  Hemoglobin slightly dropped postsurgery.  Outpatient follow-up. ? ?Discharge Instructions ? ?Discharge Instructions   ? ? Diet - low sodium heart healthy   Complete by: As directed ?  ? Increase activity slowly   Complete by: As directed ?  ? No wound care   Complete by: As directed ?  ? As per ortho recommendations  ? ?  ? ?Allergies as of 06/10/2021   ? ?   Reactions  ? Erythromycin   ? Headaches  ? Erythromycin Base   ? ?  ? ?  ?Medication List  ?  ? ?TAKE these medications   ? ?acetaminophen 500 MG tablet ?Commonly known as: TYLENOL ?Take 1,000 mg by mouth every 6 (six) hours as needed for mild pain or moderate pain. ?  ?aspirin 81 MG chewable tablet ?Chew 1 tablet (81 mg total) by mouth 2 (two) times daily. ?  ?ergocalciferol 1.25 MG (50000 UT) capsule ?Commonly known as: VITAMIN D2 ?Take 50,000 Units by mouth once a week. Fridays ?  ?HYDROcodone-acetaminophen 10-325 MG tablet ?Commonly known as: Norco ?Take 0.5 tablets by mouth every 4 (four) hours as needed for up to 7 days. ?  ?methocarbamol 500 MG tablet ?Commonly known as: ROBAXIN ?Take 1 tablet (500 mg total) by mouth every 6 (six) hours as needed for muscle spasms. ?  ?polyethylene glycol 17 g packet ?Commonly known as: MIRALAX / GLYCOLAX ?Take 17 g by mouth daily as needed for mild constipation. ?  ?senna-docusate 8.6-50 MG tablet ?Commonly known as: Senokot-S ?Take 1 tablet by mouth 2 (two) times daily. ?  ?trolamine salicylate 10 % cream ?Commonly known as: ASPERCREME ?Apply 1 application. topically  as needed (applies to knee when needed.). ?  ? ?  ? ? Follow-up Information   ? ? Swinteck, Aaron Edelman, MD. Schedule an appointment as soon as possible for a visit in 2 week(s).   ?Specialty: Orthopedic Surgery ?Why: For wound re-check ?Contact information: ?[address] ?[address] ?[place] [address] ?[phone] ? ? ?  ?  ? ? [name], MD. Schedule an appointment as soon as possible for a visit in 1 week(s).   ?Specialty: Pulmonary Disease ?Why: with cbc/bmp ?Contact information: ?[address] ?[place] [address] ?[phone] ? ? ?  ?  ? ? Care, [facility] Follow up.   ?Specialty: Home Health Services ?Why: [name] wil call before coming out. ?Contact information: ?[address] [place] ?[address] ?[place] [address] ?[phone] ? ? ?  ?  ? ?  ?  ? ?  ? ?Allergies  ?Allergen Reactions  ? Erythromycin   ?  Headaches  ? Erythromycin Base   ? ? ?Consultations: ?Orthopedics ? ? ?Procedures/Studies: ?DG Wrist Complete Left ? ?Result Date: 06/06/2021 ?CLINICAL DATA:  fall EXAM: LEFT WRIST - COMPLETE 3+ VIEW COMPARISON:  Wrist radiographs from 03/11/2010. FINDINGS: There is no evidence of fracture or dislocation. There is no evidence of arthropathy or other focal bone abnormality. Soft tissues are unremarkable. IMPRESSION: Negative. Electronically Signed   By: Margaretha Sheffield M.D.   On: 06/06/2021 19:14  ? ?CT HEAD WO CONTRAST (5MM) ? ?Result Date: 06/06/2021 ?CLINICAL DATA:  Syncope/presyncope, cerebrovascular cause suspected EXAM: CT HEAD WITHOUT CONTRAST TECHNIQUE: Contiguous axial images were obtained from the base of the skull through the vertex without intravenous contrast. RADIATION DOSE REDUCTION: This exam was performed according to the departmental dose-optimization program which includes automated exposure control, adjustment of the mA and/or kV according to patient size and/or use of iterative reconstruction technique. COMPARISON:  None. FINDINGS: Brain: Ventriculomegaly, which is out of proportion to the degree of sulcal enlargement. There also is some crowding of sulci at the vertex with mildly acute callosal angle. No evidence of acute large vascular territory infarct, acute hemorrhage, mass lesion, or midline shift. Vascular: No  hyperdense vessel identified. Skull: No acute fracture. Sinuses/Orbits: Clear sinuses.  No acute orbital findings. Other: No mastoid effusions. IMPRESSION: Ventriculomegaly with some findings that can be seen with normal pressure hydrocephalus (NPH). Central predominant atrophy is a differential consideration. Recommend correlation with the presence or absence of signs/symptoms of NPH. Electronically Signed   By: Margaretha Sheffield M.D.   On: 06/06/2021 19:19  ? ?DG Pelvis Portable ? ?Result Date: 06/08/2021 ?CLINICAL DATA:  Left hip arthroplasty EXAM: PORTABLE PELVIS 1-2 VIEWS COMPARISON:  06/07/2021 FINDINGS: Two C-arm fluoroscopic images were obtained intraoperatively and submitted for post operative interpretation. Left total hip arthroplasty hardware in place a and appropriately aligned. 9 seconds fluoroscopy time. Radiation dose: 1.49 mGy. Please see the performing provider's procedural report for further detail. IMPRESSION: As above. Electronically Signed   By: Davina Poke D.O.   On: 06/08/2021 11:13  ? ?CT HIP LEFT WO CONTRAST ? ?Result Date: 06/07/2021 ?CLINICAL DATA:  Left hip fracture. EXAM: CT OF THE LEFT HIP WITHOUT CONTRAST TECHNIQUE: Multidetector CT imaging of the left hip was performed according to the standard protocol. Multiplanar CT image reconstructions were also generated. RADIATION DOSE REDUCTION: This exam was performed according to the departmental dose-optimization program which includes automated exposure control, adjustment of the mA and/or kV according to patient size and/or use of iterative reconstruction technique. COMPARISON:  Left hip  x-rays from yesterday. FINDINGS: Bones/Joint/Cartilage Unchanged acute mildly impacted subcapital left femoral neck fracture. No additional fracture. No dislocation. Mild bilateral hip osteoarthritis. Osteopenia. Small left hip joint effusion. Degenerative changes of the pubic symphysis and sacroiliac joints. Severe lower lumbar degenerative disc  disease. Advanced lower lumbar facet arthropathy with grade 1 anterolisthesis at L5-S1. Ligaments Ligaments are suboptimally evaluated by CT. Muscles and Tendons Grossly intact.  No muscle atrophy. Soft tissue No

## 2021-06-10 NOTE — Progress Notes (Signed)
Patient's walker found in the ED and given to the patient.  ?

## 2021-06-10 NOTE — Progress Notes (Signed)
Physical Therapy Treatment ?Patient Details ?Name: Tina Clements ?MRN: [id number] ?DOB: 1940-12-11 ?Today's Date: 06/10/2021 ? ? ?History of Present Illness Tina Clements is a 81 y.o. female with a past medical history of osteoarthritis, osteopenia, and hyperlipidemia who frequently gets dizzy.  She had a ground-level fall after she got dizzy at home on 06/03/2021.  She had hip pain and inability to weight-bear.  When she did not get any better, she came to the emergency department at [facility].  X-rays and CT scan of the left hip revealed a comminuted, impacted, mildly retroverted subcapital femoral neck fracture. Pt is s/p L THA anterior approach. ? ?  ?PT Comments  ? ? Focus of session today was STS, self care, gait, therex and stair training. The patient tolerated well but reports mild stiffness in L LE at the beginning of the session.  Pt. Shows overall improvement with her functional mobility, transfer ability, gait, and stair navigation. She shows good understanding and performance of seated exercises for L LE strengthening. L LE strength, balance, functional endurance, and slowed processing are still limiting function. Pt. Would benefit from skilled PT to continue to address her functional mobility, transfer ability, balance, gait and endurance. Plan and discharge setting remains unchanged. Pt to follow acutely as appropriate.  ?   ?Recommendations for follow up therapy are one component of a multi-disciplinary discharge planning process, led by the attending physician.  Recommendations may be updated based on patient status, additional functional criteria and insurance authorization. ? ?Follow Up Recommendations ? Home health PT ?  ?  ?Assistance Recommended at Discharge PRN  ?Patient can return home with the following A little help with walking and/or transfers;Help with stairs or ramp for entrance;Assist for transportation ?  ?Equipment Recommendations ? None recommended by PT  ?  ?    ?Precautions / Restrictions Precautions ?Precautions: Fall ?Restrictions ?Weight Bearing Restrictions: No ?LLE Weight Bearing: Weight bearing as tolerated  ?  ? ?Mobility ? Bed Mobility ?Overal bed mobility: Needs Assistance ?  ?  ?  ?  ?  ?  ?General bed mobility comments: Pt in chair upon arrival ?  ? ?Transfers ?Overall transfer level: Needs assistance ?Equipment used: Rolling walker (2 wheels) ?Transfers: Sit to/from Stand ?Sit to Stand: Min guard ?  ?  ?  ?  ?  ?General transfer comment: min guard for saftey ?  ? ?Ambulation/Gait ?Ambulation/Gait assistance: Min guard ?Gait Distance (Feet): 150 Feet ?Assistive device: Rolling walker (2 wheels) ?Gait Pattern/deviations: Step-through pattern, Decreased step length - right, Trunk flexed, Decreased stance time - left, Decreased weight shift to left ?Gait velocity: decreased ?  ?  ?General Gait Details: Cues for upright trunk during gait ? ? ?Stairs ?Stairs: Yes ?Stairs assistance: Min guard ?Stair Management: Two rails, Step to pattern ?Number of Stairs: 4 ?General stair comments: Step to pattern up with R leg and down with L. Min guard below pt for saftey. Shows good technique. ? ? ? ? ? ?  ?Balance Overall balance assessment: Needs assistance ?  ?Sitting balance-Leahy Scale: Good ?Sitting balance - Comments: Able to sit edge of chair w/o issue ?  ?  ?  ?Standing balance comment: Requires RW for mobility, abel to maintain prolonged semisquat while toileting ?  ?  ?  ?  ?  ?  ?  ?  ?  ?  ?  ?  ? ?  ?Cognition Arousal/Alertness: Awake/alert ?Behavior During Therapy: [facility] for tasks assessed/performed ?Overall Cognitive Status: No family/caregiver  present to determine baseline cognitive functioning ?  ?  ?  ?  ?  ?  ?  ?  ?  ?  ?  ?  ?  ?  ?  ?  ?General Comments: Presents with increased processing time throughout session, unsure if baseline. ?  ?  ? ?  ?Exercises Total Joint Exercises ?Ankle Circles/Pumps: AROM, Left, 10 reps ?Quad Sets: AROM, Left, 10 reps (with  hold) ?Heel Slides: Left, 10 reps, Supine, AROM ? ?  ?General Comments General comments (skin integrity, edema, etc.): Pt is HOH and requires sequecning cues for tasks ?  ?  ? ? ? ?PT Goals (current goals can now be found in the care plan section) Acute Rehab PT Goals ?Patient Stated Goal: go home ?PT Goal Formulation: With patient ?Time For Goal Achievement: 06/23/21 ?Potential to Achieve Goals: Good ?Progress towards PT goals: Progressing toward goals ? ?  ?Frequency ? ? ? Min 6X/week ? ? ? ?  ?PT Plan Current plan remains appropriate  ? ? ?   ?AM-PAC PT "6 Clicks" Mobility   ?Outcome Measure ? Help needed turning from your back to your side while in a flat bed without using bedrails?: A Little ?Help needed moving from lying on your back to sitting on the side of a flat bed without using bedrails?: A Little ?Help needed moving to and from a bed to a chair (including a wheelchair)?: A Little ?Help needed standing up from a chair using your arms (e.g., wheelchair or bedside chair)?: A Little ?Help needed to walk in hospital room?: A Little ?Help needed climbing 3-5 steps with a railing? : A Little ?6 Click Score: 18 ? ?  ?End of Session Equipment Utilized During Treatment: Gait belt ?Activity Tolerance: Patient tolerated treatment well ?Patient left: in chair;with call bell/phone within reach;with chair alarm set ?Nurse Communication: Mobility status ?PT Visit Diagnosis: Muscle weakness (generalized) (M62.81);Difficulty in walking, not elsewhere classified (R26.2) ?  ? ? ?Time: 4235-3614 ?PT Time Calculation (min) (ACUTE ONLY): 25 min ? ?Charges:  $Gait Training: 8-22 mins ?$Therapeutic Exercise: 8-22 mins          ?          ? ?Thermon Leyland, SPT ?Acute Rehab Services ? ? ? ?Thermon Leyland ?06/10/2021, 11:45 AM ? ?

## 2021-06-10 NOTE — Care Management Important Message (Signed)
Important Message ? ?Patient Details IM Letter given to the Patient. ?Name: Tina Clements ?MRN: [id number] ?Date of Birth: 1940-02-21 ? ? ?Medicare Important Message Given:  Yes ? ? ? ? ?[name] ?06/10/2021, 12:11 PM ?

## 2021-06-10 NOTE — Progress Notes (Signed)
Patient discharged home with husband, discharge instructions given and explained to patient, she verbalized understanding, patient denies any pain/distress, Surgical dressing clean/dry/intact, no wound or dressing noted. accompanied home by husband.  ?

## 2021-06-12 DIAGNOSIS — Z96642 Presence of left artificial hip joint: Secondary | ICD-10-CM | POA: Diagnosis not present

## 2021-06-12 DIAGNOSIS — S72091D Other fracture of head and neck of right femur, subsequent encounter for closed fracture with routine healing: Secondary | ICD-10-CM | POA: Diagnosis not present

## 2021-06-12 DIAGNOSIS — W19XXXD Unspecified fall, subsequent encounter: Secondary | ICD-10-CM | POA: Diagnosis not present

## 2021-06-12 DIAGNOSIS — I499 Cardiac arrhythmia, unspecified: Secondary | ICD-10-CM | POA: Diagnosis not present

## 2021-06-12 DIAGNOSIS — Z9181 History of falling: Secondary | ICD-10-CM | POA: Diagnosis not present

## 2021-06-12 DIAGNOSIS — Z7982 Long term (current) use of aspirin: Secondary | ICD-10-CM | POA: Diagnosis not present

## 2021-06-12 DIAGNOSIS — M1990 Unspecified osteoarthritis, unspecified site: Secondary | ICD-10-CM | POA: Diagnosis not present

## 2021-06-12 DIAGNOSIS — D509 Iron deficiency anemia, unspecified: Secondary | ICD-10-CM | POA: Diagnosis not present

## 2021-06-12 DIAGNOSIS — G9389 Other specified disorders of brain: Secondary | ICD-10-CM | POA: Diagnosis not present

## 2021-06-12 DIAGNOSIS — M8580 Other specified disorders of bone density and structure, unspecified site: Secondary | ICD-10-CM | POA: Diagnosis not present

## 2021-06-12 DIAGNOSIS — Z96651 Presence of right artificial knee joint: Secondary | ICD-10-CM | POA: Diagnosis not present

## 2021-06-12 DIAGNOSIS — E785 Hyperlipidemia, unspecified: Secondary | ICD-10-CM | POA: Diagnosis not present

## 2021-06-14 DIAGNOSIS — S72091D Other fracture of head and neck of right femur, subsequent encounter for closed fracture with routine healing: Secondary | ICD-10-CM | POA: Diagnosis not present

## 2021-06-14 DIAGNOSIS — D509 Iron deficiency anemia, unspecified: Secondary | ICD-10-CM | POA: Diagnosis not present

## 2021-06-14 DIAGNOSIS — M1990 Unspecified osteoarthritis, unspecified site: Secondary | ICD-10-CM | POA: Diagnosis not present

## 2021-06-14 DIAGNOSIS — I499 Cardiac arrhythmia, unspecified: Secondary | ICD-10-CM | POA: Diagnosis not present

## 2021-06-14 DIAGNOSIS — E785 Hyperlipidemia, unspecified: Secondary | ICD-10-CM | POA: Diagnosis not present

## 2021-06-14 DIAGNOSIS — G9389 Other specified disorders of brain: Secondary | ICD-10-CM | POA: Diagnosis not present

## 2021-06-18 DIAGNOSIS — Z8249 Family history of ischemic heart disease and other diseases of the circulatory system: Secondary | ICD-10-CM | POA: Diagnosis not present

## 2021-06-18 DIAGNOSIS — M1990 Unspecified osteoarthritis, unspecified site: Secondary | ICD-10-CM | POA: Diagnosis not present

## 2021-06-18 DIAGNOSIS — G9389 Other specified disorders of brain: Secondary | ICD-10-CM | POA: Diagnosis not present

## 2021-06-18 DIAGNOSIS — E559 Vitamin D deficiency, unspecified: Secondary | ICD-10-CM | POA: Diagnosis not present

## 2021-06-18 DIAGNOSIS — R2689 Other abnormalities of gait and mobility: Secondary | ICD-10-CM | POA: Diagnosis not present

## 2021-06-18 DIAGNOSIS — Z96642 Presence of left artificial hip joint: Secondary | ICD-10-CM | POA: Diagnosis not present

## 2021-06-18 DIAGNOSIS — S72091D Other fracture of head and neck of right femur, subsequent encounter for closed fracture with routine healing: Secondary | ICD-10-CM | POA: Diagnosis not present

## 2021-06-18 DIAGNOSIS — E785 Hyperlipidemia, unspecified: Secondary | ICD-10-CM | POA: Diagnosis not present

## 2021-06-18 DIAGNOSIS — D509 Iron deficiency anemia, unspecified: Secondary | ICD-10-CM | POA: Diagnosis not present

## 2021-06-18 DIAGNOSIS — M2550 Pain in unspecified joint: Secondary | ICD-10-CM | POA: Diagnosis not present

## 2021-06-18 DIAGNOSIS — I499 Cardiac arrhythmia, unspecified: Secondary | ICD-10-CM | POA: Diagnosis not present

## 2021-06-18 DIAGNOSIS — Z96651 Presence of right artificial knee joint: Secondary | ICD-10-CM | POA: Diagnosis not present

## 2021-06-18 DIAGNOSIS — E7800 Pure hypercholesterolemia, unspecified: Secondary | ICD-10-CM | POA: Diagnosis not present

## 2021-06-18 DIAGNOSIS — S72012D Unspecified intracapsular fracture of left femur, subsequent encounter for closed fracture with routine healing: Secondary | ICD-10-CM | POA: Diagnosis not present

## 2021-06-18 DIAGNOSIS — Z79899 Other long term (current) drug therapy: Secondary | ICD-10-CM | POA: Diagnosis not present

## 2021-06-20 DIAGNOSIS — S72091D Other fracture of head and neck of right femur, subsequent encounter for closed fracture with routine healing: Secondary | ICD-10-CM | POA: Diagnosis not present

## 2021-06-20 DIAGNOSIS — M1990 Unspecified osteoarthritis, unspecified site: Secondary | ICD-10-CM | POA: Diagnosis not present

## 2021-06-20 DIAGNOSIS — I499 Cardiac arrhythmia, unspecified: Secondary | ICD-10-CM | POA: Diagnosis not present

## 2021-06-20 DIAGNOSIS — E785 Hyperlipidemia, unspecified: Secondary | ICD-10-CM | POA: Diagnosis not present

## 2021-06-20 DIAGNOSIS — G9389 Other specified disorders of brain: Secondary | ICD-10-CM | POA: Diagnosis not present

## 2021-06-20 DIAGNOSIS — D509 Iron deficiency anemia, unspecified: Secondary | ICD-10-CM | POA: Diagnosis not present

## 2021-06-21 DIAGNOSIS — S72032D Displaced midcervical fracture of left femur, subsequent encounter for closed fracture with routine healing: Secondary | ICD-10-CM | POA: Diagnosis not present

## 2021-06-25 DIAGNOSIS — E785 Hyperlipidemia, unspecified: Secondary | ICD-10-CM | POA: Diagnosis not present

## 2021-06-25 DIAGNOSIS — D509 Iron deficiency anemia, unspecified: Secondary | ICD-10-CM | POA: Diagnosis not present

## 2021-06-25 DIAGNOSIS — S72091D Other fracture of head and neck of right femur, subsequent encounter for closed fracture with routine healing: Secondary | ICD-10-CM | POA: Diagnosis not present

## 2021-06-25 DIAGNOSIS — I499 Cardiac arrhythmia, unspecified: Secondary | ICD-10-CM | POA: Diagnosis not present

## 2021-06-25 DIAGNOSIS — M1990 Unspecified osteoarthritis, unspecified site: Secondary | ICD-10-CM | POA: Diagnosis not present

## 2021-06-25 DIAGNOSIS — G9389 Other specified disorders of brain: Secondary | ICD-10-CM | POA: Diagnosis not present

## 2021-06-27 ENCOUNTER — Encounter: Payer: Self-pay | Admitting: *Deleted

## 2021-07-01 DIAGNOSIS — L819 Disorder of pigmentation, unspecified: Secondary | ICD-10-CM | POA: Diagnosis not present

## 2021-07-02 ENCOUNTER — Ambulatory Visit (INDEPENDENT_AMBULATORY_CARE_PROVIDER_SITE_OTHER): Payer: Medicare Other | Admitting: Diagnostic Neuroimaging

## 2021-07-02 ENCOUNTER — Encounter: Payer: Self-pay | Admitting: Diagnostic Neuroimaging

## 2021-07-02 VITALS — BP 138/79 | HR 75 | Ht 66.5 in | Wt 163.4 lb

## 2021-07-02 DIAGNOSIS — R55 Syncope and collapse: Secondary | ICD-10-CM | POA: Diagnosis not present

## 2021-07-02 DIAGNOSIS — E785 Hyperlipidemia, unspecified: Secondary | ICD-10-CM | POA: Diagnosis not present

## 2021-07-02 DIAGNOSIS — G9389 Other specified disorders of brain: Secondary | ICD-10-CM | POA: Diagnosis not present

## 2021-07-02 DIAGNOSIS — D509 Iron deficiency anemia, unspecified: Secondary | ICD-10-CM | POA: Diagnosis not present

## 2021-07-02 DIAGNOSIS — S72091D Other fracture of head and neck of right femur, subsequent encounter for closed fracture with routine healing: Secondary | ICD-10-CM | POA: Diagnosis not present

## 2021-07-02 DIAGNOSIS — I499 Cardiac arrhythmia, unspecified: Secondary | ICD-10-CM | POA: Diagnosis not present

## 2021-07-02 DIAGNOSIS — M1990 Unspecified osteoarthritis, unspecified site: Secondary | ICD-10-CM | POA: Diagnosis not present

## 2021-07-02 NOTE — Progress Notes (Signed)
? ?[facility] ? ?PATIENT: Tina Clements ?DOB: [birth year] ? ?REFERRING CLINICIAN: [name], MD ?HISTORY FROM: patient ?REASON FOR VISIT: new consult ? ? ?HISTORICAL ? ?CHIEF COMPLAINT:  ?Chief Complaint  ?Patient presents with  ? New Patient (Initial Visit)  ?  Rm 7 alone reports she is here to f/u from 06/06/21 ER visit. Pt reports she passed out at home which resulted in her breaking her left hip. She reports one other event 4 years prior but no injury. Reports since this event she completed replacement and occasional dizziness (not debilitating)   ? ? ?HISTORY OF PRESENT ILLNESS:  ? ?81 year old female here for evaluation of syncope. ? ?06/06/21 patient presented to the ER after falling down 3 days prior.  Patient was at home with husband standing up.  Patient had sudden loss of consciousness and fell down.  She does not remember what happened.  Her husband was nearby and heard the sound of her falling.  He came to her aid.  She had fallen forward.  She was slowly and gradually trying to get up.  Within a minute or 2 she was fully conscious.  No witnessed convulsions or seizures.  No incontinence.  She gradually got up and recovered.  Due to left wrist and left hip/leg pain, they went to the emergency room 3 days later for evaluation.  She was found to have a left femoral neck fracture.  This was treated with surgery.  Lab testing and echocardiogram are unremarkable.  CT of the head showed moderate to severe ventriculomegaly and atrophy.  Patient referred here for follow-up of this CT finding and syncope. ? ?No prior mention of memory loss, incontinence or gait difficulty according to patient or husband. ? ? ?REVIEW OF SYSTEMS: Full 14 system review of systems performed and negative with exception of: as per HPI. ? ?ALLERGIES: ?Allergies  ?Allergen Reactions  ? Erythromycin   ?  Headaches  ? Erythromycin Base   ? ? ?HOME MEDICATIONS: ?Outpatient Medications Prior to Visit   ?Medication Sig Dispense Refill  ? acetaminophen (TYLENOL) 500 MG tablet Take 1,000 mg by mouth every 6 (six) hours as needed for mild pain or moderate pain.    ? aspirin 81 MG chewable tablet Chew 1 tablet (81 mg total) by mouth 2 (two) times daily. 90 tablet 0  ? ergocalciferol (VITAMIN D2) 1.25 MG (50000 UT) capsule Take 50,000 Units by mouth once a week. Fridays    ? methocarbamol (ROBAXIN) 500 MG tablet Take 1 tablet (500 mg total) by mouth every 6 (six) hours as needed for muscle spasms. 30 tablet 0  ? polyethylene glycol (MIRALAX / GLYCOLAX) 17 g packet Take 17 g by mouth daily as needed for mild constipation. 14 each 0  ? senna-docusate (SENOKOT-S) 8.6-50 MG tablet Take 1 tablet by mouth 2 (two) times daily. 30 tablet 0  ? trolamine salicylate (ASPERCREME) 10 % cream Apply 1 application. topically as needed (applies to knee when needed.).    ? ?No facility-administered medications prior to visit.  ? ? ?PAST MEDICAL HISTORY: ?Past Medical History:  ?Diagnosis Date  ? Arthritis   ? Cerebral ventriculomegaly   ? CKD (chronic kidney disease), stage III ([place])   ? Dizziness   ? Dyspnea   ? with excertion  ? Dysrhythmia 2000  ? paplatations and fast  ? Essential hypertension   ? GERD (gastroesophageal reflux disease)   ? Hyperlipidemia   ? Sarcoidosis   ? Syncope and collapse   ? ? ?  PAST SURGICAL HISTORY: ?Past Surgical History:  ?Procedure Laterality Date  ? ABDOMINAL HYSTERECTOMY    ? KNEE ARTHROSCOPY    ? RADICAL HYSTERECTOMY WITH TRANSPOSITION OF OVARIES    ? SHOULDER ARTHROSCOPY    ? TONSILLECTOMY    ? TOTAL HIP ARTHROPLASTY Left 06/08/2021  ? Procedure: TOTAL HIP ARTHROPLASTY ANTERIOR APPROACH;  Surgeon: Rod Can, MD;  Location: [facility];  Service: Orthopedics;  Laterality: Left;  ? TOTAL KNEE ARTHROPLASTY Right 04/25/2019  ? Procedure: TOTAL KNEE ARTHROPLASTY;  Surgeon: Gaynelle Arabian, MD;  Location: [facility];  Service: Orthopedics;  Laterality: Right;  80mn  ? ? ?FAMILY HISTORY: ?Family History  ?Problem  Relation Age of Onset  ? Heart disease Mother   ? Heart attack Mother   ? Cancer Father   ?     prostate  ? Cancer Sister   ? Hepatitis C Sister   ? Sarcoidosis Sister   ? ? ?SOCIAL HISTORY: ?Social History  ? ?Socioeconomic History  ? Marital status: Married  ?  Spouse name: Not on file  ? Number of children: Not on file  ? Years of education: Not on file  ? Highest education level: Not on file  ?Occupational History  ? Not on file  ?Tobacco Use  ? Smoking status: Never  ? Smokeless tobacco: Never  ?Vaping Use  ? Vaping Use: Never used  ?Substance and Sexual Activity  ? Alcohol use: Not Currently  ?  Comment: occasional wine   ? Drug use: No  ? Sexual activity: Yes  ?  Birth control/protection: None  ?Other Topics Concern  ? Not on file  ?Social History Narrative  ? Right handed  ? Caffeine- only tea 1 cup per day   ? Lives at home with husband  ? ?Social Determinants of Health  ? ?Financial Resource Strain: Not on file  ?Food Insecurity: Not on file  ?Transportation Needs: Not on file  ?Physical Activity: Not on file  ?Stress: Not on file  ?Social Connections: Not on file  ?Intimate Partner Violence: Not on file  ? ? ? ?PHYSICAL EXAM ? ?GENERAL EXAM/CONSTITUTIONAL: ?Vitals:  ?Vitals:  ? 07/02/21 1441  ?BP: 138/79  ?Pulse: 75  ?Weight: 163 lb 6 oz (74.1 kg)  ?Height: 5' 6.5" (1.689 m)  ? ?Body mass index is 25.97 kg/m?. ?Wt Readings from Last 3 Encounters:  ?07/02/21 163 lb 6 oz (74.1 kg)  ?06/10/21 174 lb 13.2 oz (79.3 kg)  ?04/25/19 167 lb 5 oz (75.9 kg)  ? ?Patient is in no distress; well developed, nourished and groomed; neck is supple ? ?CARDIOVASCULAR: ?Examination of carotid arteries is normal; no carotid bruits ?Regular rate and rhythm, no murmurs ?Examination of peripheral vascular system by observation and palpation is normal ? ?EYES: ?Ophthalmoscopic exam of optic discs and posterior segments is normal; no papilledema or hemorrhages ?No results found. ? ?MUSCULOSKELETAL: ?Gait, strength, tone, movements  noted in Neurologic exam below ? ?NEUROLOGIC: ?MENTAL STATUS:  ?   ? View : No data to display.  ?  ?  ?  ? ?awake, alert, oriented to person, place and time ?recent and remote memory intact ?normal attention and concentration ?language fluent, comprehension intact, naming intact ?fund of knowledge appropriate ? ?CRANIAL NERVE:  ?2nd - no papilledema on fundoscopic exam ?2nd, 3rd, 4th, 6th - pupils equal and reactive to light, visual fields full to confrontation, extraocular muscles intact, no nystagmus ?5th - facial sensation symmetric ?7th - facial strength symmetric ?8th - hearing intact ?9th - palate  elevates symmetrically, uvula midline ?11th - shoulder shrug symmetric ?12th - tongue protrusion midline ? ?MOTOR:  ?normal bulk and tone, full strength in the BUE, BLE ? ?SENSORY:  ?normal and symmetric to light touch, temperature, vibration ? ?COORDINATION:  ?finger-nose-finger, fine finger movements normal ? ?REFLEXES:  ?deep tendon reflexes TRACE and symmetric ? ?GAIT/STATION:  ?narrow based gait; SLOW, CAUTIOUS; USING WALKER ? ? ? ? ?DIAGNOSTIC DATA (LABS, IMAGING, TESTING) ?- I reviewed patient records, labs, notes, testing and imaging myself where available. ? ?Lab Results  ?Component Value Date  ? WBC 13.2 (H) 06/10/2021  ? HGB 8.1 (L) 06/10/2021  ? HCT 25.0 (L) 06/10/2021  ? MCV 79.9 (L) 06/10/2021  ? PLT 172 06/10/2021  ? ?   ?Component Value Date/Time  ? NA 139 06/10/2021 0303  ? K 3.8 06/10/2021 0303  ? CL 108 06/10/2021 0303  ? CO2 26 06/10/2021 0303  ? GLUCOSE 113 (H) 06/10/2021 0303  ? BUN 14 06/10/2021 0303  ? CREATININE 0.71 06/10/2021 0303  ? CALCIUM 8.5 (L) 06/10/2021 0303  ? PROT 5.3 (L) 06/09/2021 0319  ? ALBUMIN 3.3 (L) 06/09/2021 0319  ? AST 25 06/09/2021 0319  ? ALT 29 06/09/2021 0319  ? ALKPHOS 76 06/09/2021 0319  ? BILITOT 1.0 06/09/2021 0319  ? GFRNONAA >60 06/10/2021 0303  ? GFRAA >60 04/26/2019 0314  ? ?No results found for: CHOL, HDL, LDLCALC, LDLDIRECT, TRIG, CHOLHDL ?No results found  for: HGBA1C ?No results found for: VITAMINB12 ?No results found for: TSH ? ? ?06/06/21 CT head [I reviewed images myself and agree with interpretation. -VRP]  ?Ventriculomegaly with some findings that can be seen

## 2021-07-02 NOTE — Patient Instructions (Signed)
SYNCOPE (passing out) ? ?- unprovoked; unclear etiology ? ?- check EEG ? ?- According to  law, you can not drive unless you are seizure / syncope free for at least 6 months and under physician's care.  ? ?- Please maintain precautions. Do not participate in activities where a loss of awareness could harm you or someone else. No swimming alone, no tub bathing, no hot tubs, no driving, no operating motorized vehicles (cars, ATVs, motocycles, etc), lawnmowers, power tools or firearms. No standing at heights, such as rooftops, ladders or stairs. Avoid hot objects such as stoves, heaters, open fires. Wear a helmet when riding a bicycle, scooter, skateboard, etc. and avoid areas of traffic. Set your water heater to 120 degrees or less.  ? ? ?Abnormal CT head findings ?- ventriculomegaly and atrophy noted ?- no major signs of NPH by history or exam ?- monitor; could consider LP in future, but patient not likely interested in shunt surgery ?

## 2021-07-09 ENCOUNTER — Ambulatory Visit (INDEPENDENT_AMBULATORY_CARE_PROVIDER_SITE_OTHER): Payer: Medicare Other | Admitting: Diagnostic Neuroimaging

## 2021-07-09 DIAGNOSIS — R55 Syncope and collapse: Secondary | ICD-10-CM | POA: Diagnosis not present

## 2021-07-10 DIAGNOSIS — D509 Iron deficiency anemia, unspecified: Secondary | ICD-10-CM | POA: Diagnosis not present

## 2021-07-10 DIAGNOSIS — G9389 Other specified disorders of brain: Secondary | ICD-10-CM | POA: Diagnosis not present

## 2021-07-10 DIAGNOSIS — S72091D Other fracture of head and neck of right femur, subsequent encounter for closed fracture with routine healing: Secondary | ICD-10-CM | POA: Diagnosis not present

## 2021-07-10 DIAGNOSIS — M1990 Unspecified osteoarthritis, unspecified site: Secondary | ICD-10-CM | POA: Diagnosis not present

## 2021-07-10 DIAGNOSIS — E785 Hyperlipidemia, unspecified: Secondary | ICD-10-CM | POA: Diagnosis not present

## 2021-07-10 DIAGNOSIS — I499 Cardiac arrhythmia, unspecified: Secondary | ICD-10-CM | POA: Diagnosis not present

## 2021-07-17 ENCOUNTER — Telehealth: Payer: Self-pay | Admitting: Diagnostic Neuroimaging

## 2021-07-17 NOTE — Telephone Encounter (Signed)
Please result/advise

## 2021-07-17 NOTE — Telephone Encounter (Signed)
Pt is asking for a call with results to the EEG when available.

## 2021-07-19 DIAGNOSIS — S72032D Displaced midcervical fracture of left femur, subsequent encounter for closed fracture with routine healing: Secondary | ICD-10-CM | POA: Diagnosis not present

## 2021-07-22 NOTE — Procedures (Signed)
   [facility]  EEG (ELECTROENCEPHALOGRAM) REPORT   STUDY DATE: 07/09/21 PATIENT NAME: Tina Clements DOB: [birth year] MRN: [id number]  ORDERING CLINICIAN: Andrey Spearman, MD   TECHNOLOGIST: Myer Peer TECHNIQUE: Electroencephalogram was recorded utilizing standard 10-20 system of lead placement and reformatted into average and bipolar montages.  RECORDING TIME: 24 minutes ACTIVATION: photic stimulation  CLINICAL INFORMATION: 81 year old female with syncope vs seizure.  FINDINGS: Posterior dominant background rhythms, which attenuate with eye opening, ranging 10-12 hertz and 60-65 microvolts. No focal, lateralizing, epileptiform activity or seizures are seen. Patient recorded in the awake and drowsy state. EKG channel shows regular rhythm of 60-65 beats per minute.   IMPRESSION:   Normal EEG in the awake and drowsy states.    INTERPRETING PHYSICIAN:  Penni Bombard, MD Certified in Neurology, Neurophysiology and Neuroimaging  [facility] [address] [place], [place] [address] [phone]

## 2021-07-24 NOTE — Telephone Encounter (Signed)
Contacted pt, infomed her EEG was normal. Advised to call office back with questions as she had none at this time and was appreciative. Marland Kitchen

## 2021-07-25 DIAGNOSIS — Z8249 Family history of ischemic heart disease and other diseases of the circulatory system: Secondary | ICD-10-CM | POA: Diagnosis not present

## 2021-07-25 DIAGNOSIS — Z96642 Presence of left artificial hip joint: Secondary | ICD-10-CM | POA: Diagnosis not present

## 2021-07-25 DIAGNOSIS — I10 Essential (primary) hypertension: Secondary | ICD-10-CM | POA: Diagnosis not present

## 2021-07-25 DIAGNOSIS — Z0001 Encounter for general adult medical examination with abnormal findings: Secondary | ICD-10-CM | POA: Diagnosis not present

## 2021-07-25 DIAGNOSIS — Z79899 Other long term (current) drug therapy: Secondary | ICD-10-CM | POA: Diagnosis not present

## 2021-07-25 DIAGNOSIS — M1990 Unspecified osteoarthritis, unspecified site: Secondary | ICD-10-CM | POA: Diagnosis not present

## 2021-07-25 DIAGNOSIS — E559 Vitamin D deficiency, unspecified: Secondary | ICD-10-CM | POA: Diagnosis not present

## 2021-07-25 DIAGNOSIS — G9389 Other specified disorders of brain: Secondary | ICD-10-CM | POA: Diagnosis not present

## 2021-07-25 DIAGNOSIS — M2550 Pain in unspecified joint: Secondary | ICD-10-CM | POA: Diagnosis not present

## 2021-07-25 DIAGNOSIS — I119 Hypertensive heart disease without heart failure: Secondary | ICD-10-CM | POA: Diagnosis not present

## 2021-07-25 DIAGNOSIS — Z1211 Encounter for screening for malignant neoplasm of colon: Secondary | ICD-10-CM | POA: Diagnosis not present

## 2021-07-25 DIAGNOSIS — Z96651 Presence of right artificial knee joint: Secondary | ICD-10-CM | POA: Diagnosis not present

## 2021-07-25 DIAGNOSIS — E7800 Pure hypercholesterolemia, unspecified: Secondary | ICD-10-CM | POA: Diagnosis not present

## 2021-11-08 DIAGNOSIS — Z1231 Encounter for screening mammogram for malignant neoplasm of breast: Secondary | ICD-10-CM | POA: Diagnosis not present

## 2021-12-02 DIAGNOSIS — Z96642 Presence of left artificial hip joint: Secondary | ICD-10-CM | POA: Diagnosis not present

## 2021-12-02 DIAGNOSIS — S72032D Displaced midcervical fracture of left femur, subsequent encounter for closed fracture with routine healing: Secondary | ICD-10-CM | POA: Diagnosis not present

## 2021-12-26 DIAGNOSIS — R252 Cramp and spasm: Secondary | ICD-10-CM | POA: Diagnosis not present

## 2021-12-26 DIAGNOSIS — G9389 Other specified disorders of brain: Secondary | ICD-10-CM | POA: Diagnosis not present

## 2021-12-26 DIAGNOSIS — Z96651 Presence of right artificial knee joint: Secondary | ICD-10-CM | POA: Diagnosis not present

## 2021-12-26 DIAGNOSIS — E559 Vitamin D deficiency, unspecified: Secondary | ICD-10-CM | POA: Diagnosis not present

## 2021-12-26 DIAGNOSIS — L299 Pruritus, unspecified: Secondary | ICD-10-CM | POA: Diagnosis not present

## 2021-12-26 DIAGNOSIS — I10 Essential (primary) hypertension: Secondary | ICD-10-CM | POA: Diagnosis not present

## 2021-12-26 DIAGNOSIS — Z8249 Family history of ischemic heart disease and other diseases of the circulatory system: Secondary | ICD-10-CM | POA: Diagnosis not present

## 2021-12-26 DIAGNOSIS — M2550 Pain in unspecified joint: Secondary | ICD-10-CM | POA: Diagnosis not present

## 2021-12-26 DIAGNOSIS — Z79899 Other long term (current) drug therapy: Secondary | ICD-10-CM | POA: Diagnosis not present

## 2021-12-26 DIAGNOSIS — M1990 Unspecified osteoarthritis, unspecified site: Secondary | ICD-10-CM | POA: Diagnosis not present

## 2021-12-26 DIAGNOSIS — Z96642 Presence of left artificial hip joint: Secondary | ICD-10-CM | POA: Diagnosis not present

## 2021-12-26 DIAGNOSIS — E7800 Pure hypercholesterolemia, unspecified: Secondary | ICD-10-CM | POA: Diagnosis not present

## 2022-01-13 DIAGNOSIS — Z23 Encounter for immunization: Secondary | ICD-10-CM | POA: Diagnosis not present

## 2022-02-21 DIAGNOSIS — S72032D Displaced midcervical fracture of left femur, subsequent encounter for closed fracture with routine healing: Secondary | ICD-10-CM | POA: Diagnosis not present

## 2022-02-21 DIAGNOSIS — M1712 Unilateral primary osteoarthritis, left knee: Secondary | ICD-10-CM | POA: Diagnosis not present

## 2022-04-01 DIAGNOSIS — M810 Age-related osteoporosis without current pathological fracture: Secondary | ICD-10-CM | POA: Diagnosis not present

## 2022-04-01 DIAGNOSIS — Z01419 Encounter for gynecological examination (general) (routine) without abnormal findings: Secondary | ICD-10-CM | POA: Diagnosis not present

## 2022-04-11 DIAGNOSIS — H25813 Combined forms of age-related cataract, bilateral: Secondary | ICD-10-CM | POA: Diagnosis not present

## 2022-05-06 DIAGNOSIS — Z8249 Family history of ischemic heart disease and other diseases of the circulatory system: Secondary | ICD-10-CM | POA: Diagnosis not present

## 2022-05-06 DIAGNOSIS — M1990 Unspecified osteoarthritis, unspecified site: Secondary | ICD-10-CM | POA: Diagnosis not present

## 2022-05-06 DIAGNOSIS — I10 Essential (primary) hypertension: Secondary | ICD-10-CM | POA: Diagnosis not present

## 2022-05-06 DIAGNOSIS — Z96651 Presence of right artificial knee joint: Secondary | ICD-10-CM | POA: Diagnosis not present

## 2022-05-06 DIAGNOSIS — Z79899 Other long term (current) drug therapy: Secondary | ICD-10-CM | POA: Diagnosis not present

## 2022-05-06 DIAGNOSIS — E559 Vitamin D deficiency, unspecified: Secondary | ICD-10-CM | POA: Diagnosis not present

## 2022-05-06 DIAGNOSIS — R252 Cramp and spasm: Secondary | ICD-10-CM | POA: Diagnosis not present

## 2022-05-06 DIAGNOSIS — M2550 Pain in unspecified joint: Secondary | ICD-10-CM | POA: Diagnosis not present

## 2022-05-06 DIAGNOSIS — G9389 Other specified disorders of brain: Secondary | ICD-10-CM | POA: Diagnosis not present

## 2022-05-06 DIAGNOSIS — E7800 Pure hypercholesterolemia, unspecified: Secondary | ICD-10-CM | POA: Diagnosis not present

## 2022-05-06 DIAGNOSIS — L299 Pruritus, unspecified: Secondary | ICD-10-CM | POA: Diagnosis not present

## 2022-05-06 DIAGNOSIS — Z96642 Presence of left artificial hip joint: Secondary | ICD-10-CM | POA: Diagnosis not present

## 2022-06-02 DIAGNOSIS — S72032D Displaced midcervical fracture of left femur, subsequent encounter for closed fracture with routine healing: Secondary | ICD-10-CM | POA: Diagnosis not present

## 2022-06-02 DIAGNOSIS — M1712 Unilateral primary osteoarthritis, left knee: Secondary | ICD-10-CM | POA: Diagnosis not present

## 2022-07-16 DIAGNOSIS — M19111 Post-traumatic osteoarthritis, right shoulder: Secondary | ICD-10-CM | POA: Diagnosis not present

## 2022-07-19 IMAGING — CT CT HIP*L* W/O CM
2 of 4 series · 15 of 46 positions shown, 18 images · non-contrast
Comparison: Left hip x-rays from yesterday.

CLINICAL DATA: Left hip fracture.

EXAM:
CT OF THE LEFT HIP WITHOUT CONTRAST
TECHNIQUE: Multidetector CT imaging of the left hip was performed according to
the standard protocol. Multiplanar CT image reconstructions were
also generated.
RADIATION DOSE REDUCTION: This exam was performed according to the
departmental dose-optimization program which includes automated
exposure control, adjustment of the mA and/or kV according to
patient size and/or use of iterative reconstruction technique.

[Series 3: axial st · axial · 0.55mm/px · z∈[-464,-214]mm · 12 of 139 slices shown, 15 images]
[im 9/139  soft-tissue]
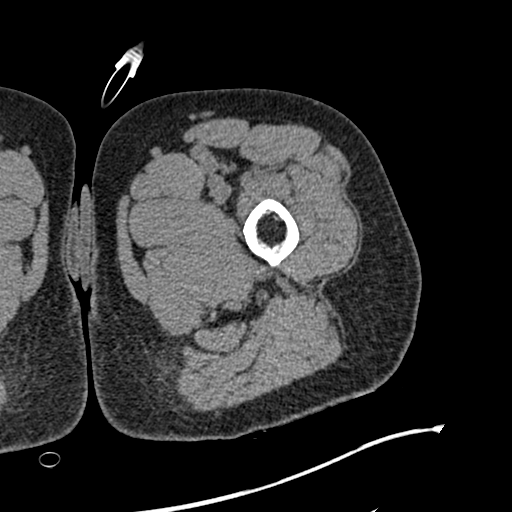
[im 9/139  bone]
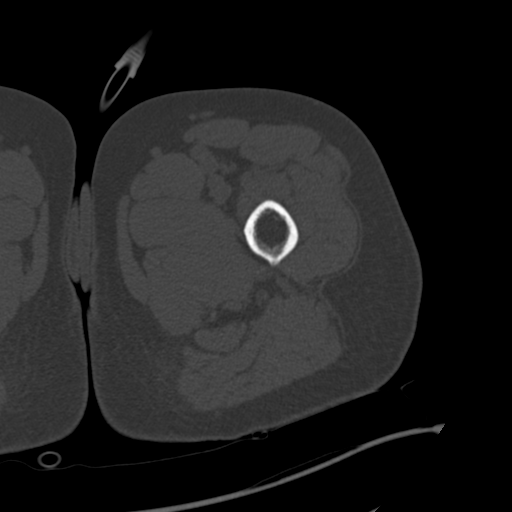
[im 27/139  soft-tissue]
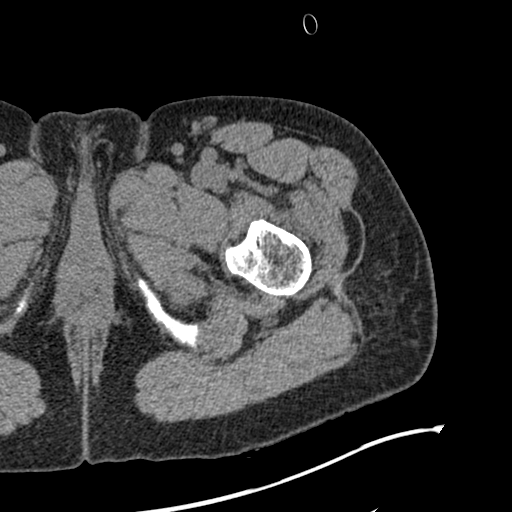
[im 41/139  soft-tissue]
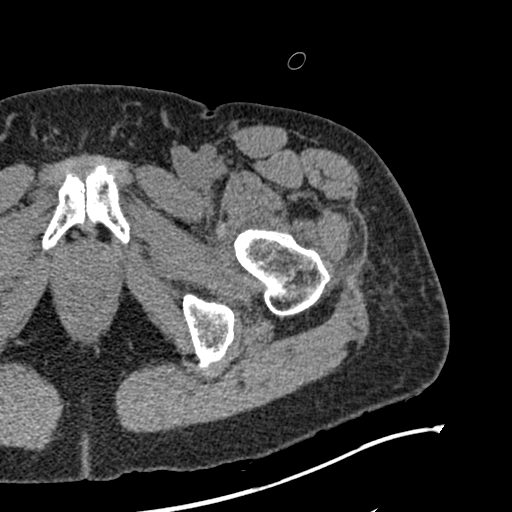
[im 54/139  soft-tissue]
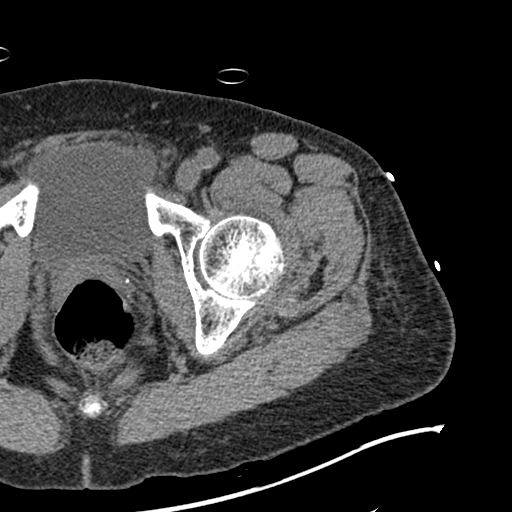
[im 72/139  soft-tissue]
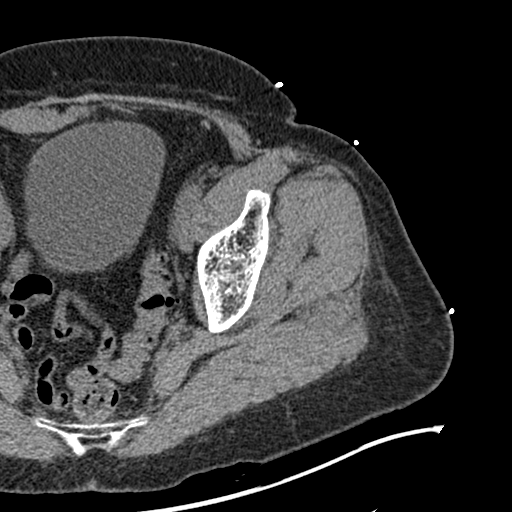
[im 85/139  soft-tissue]
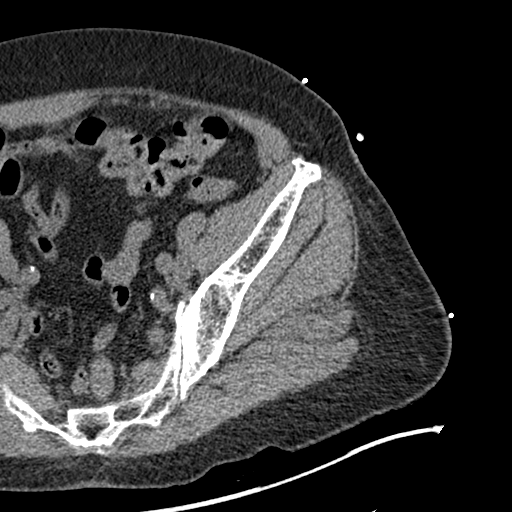
[im 98/139  soft-tissue]
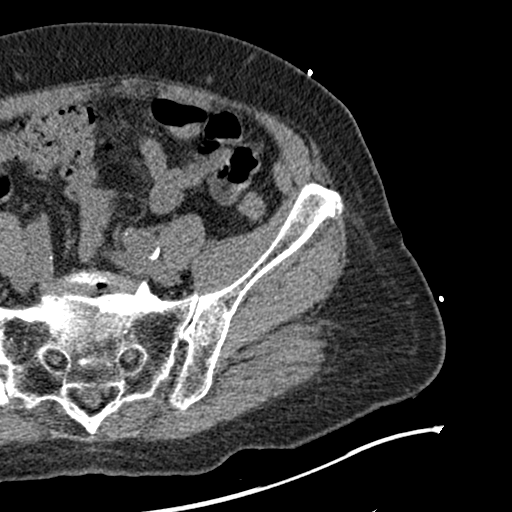
[im 116/139  soft-tissue]
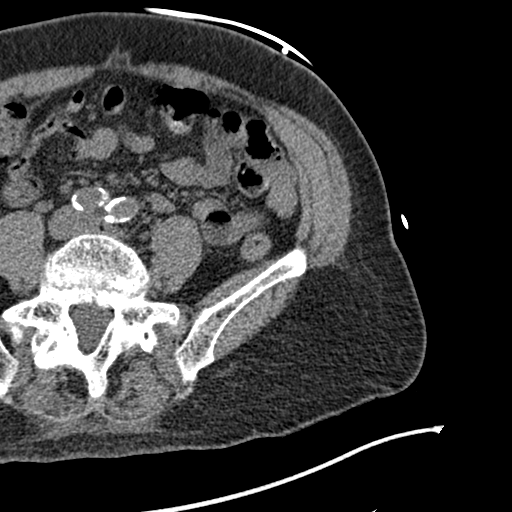
[im 121/139  lung]
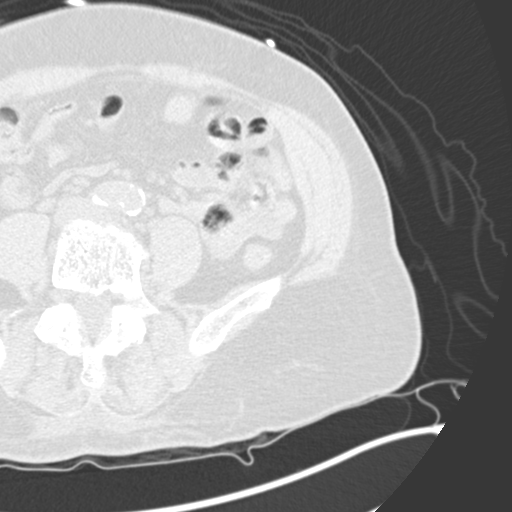
[im 125/139  lung]
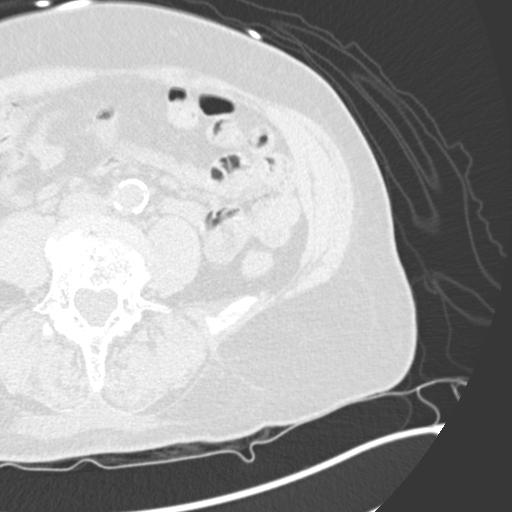
[im 130/139  soft-tissue]
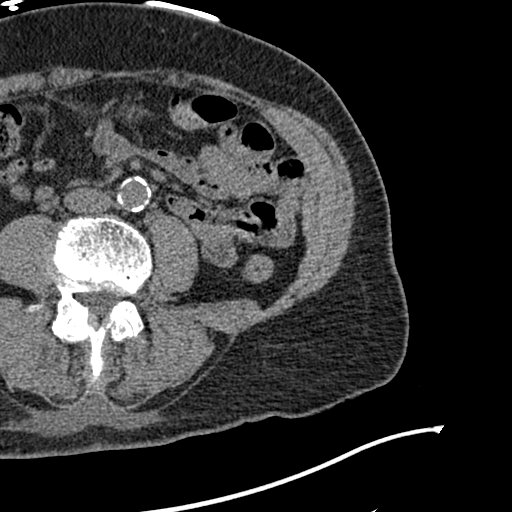
[im 130/139  lung]
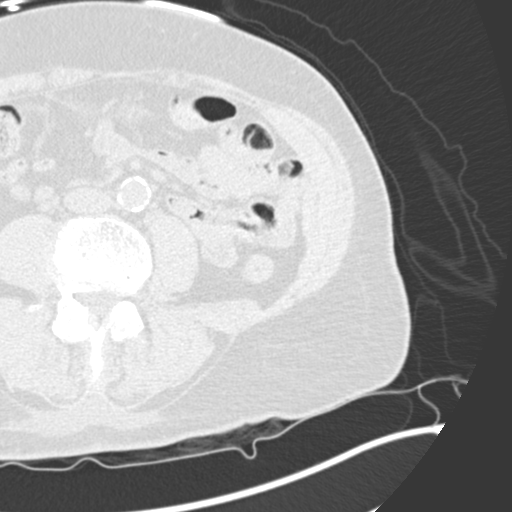
[im 130/139  bone]
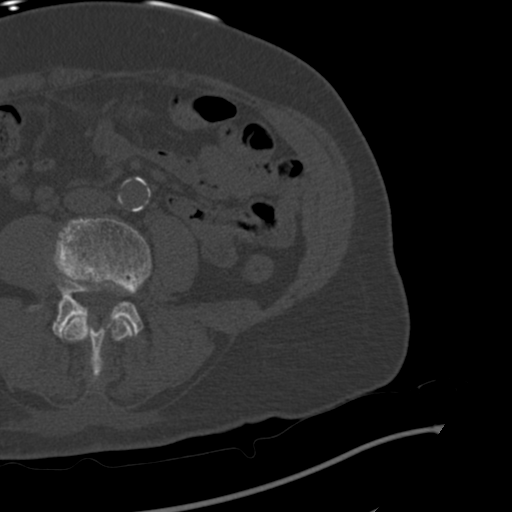
[im 134/139  lung]
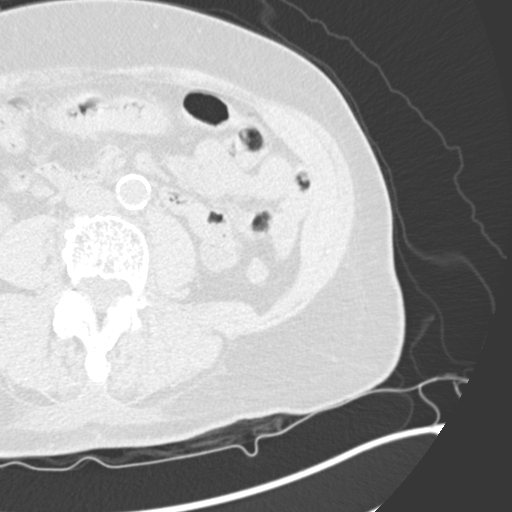

[Series 8: coronal st · coronal · 0.57mm/px · 3 of 136 slices shown]
[im 46/136  soft-tissue]
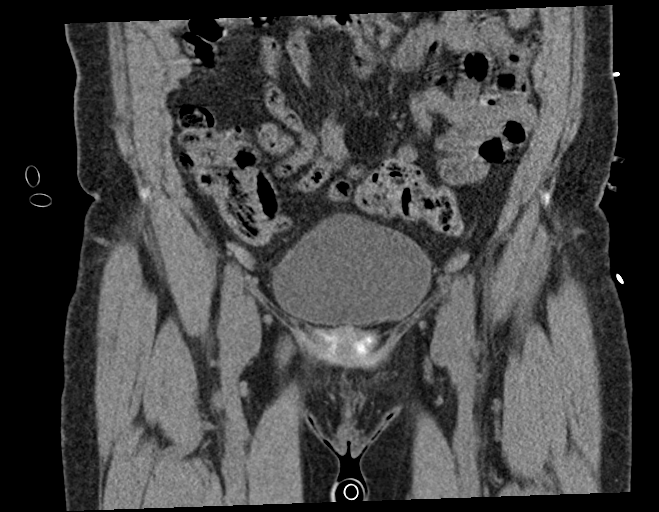
[im 61/136  soft-tissue]
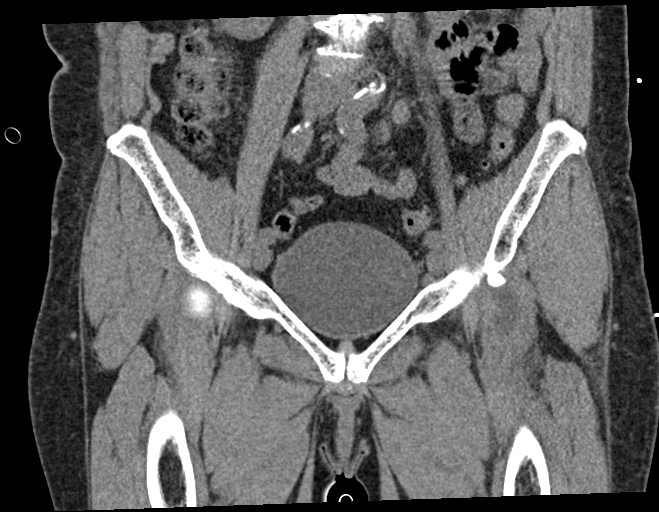
[im 76/136  soft-tissue]
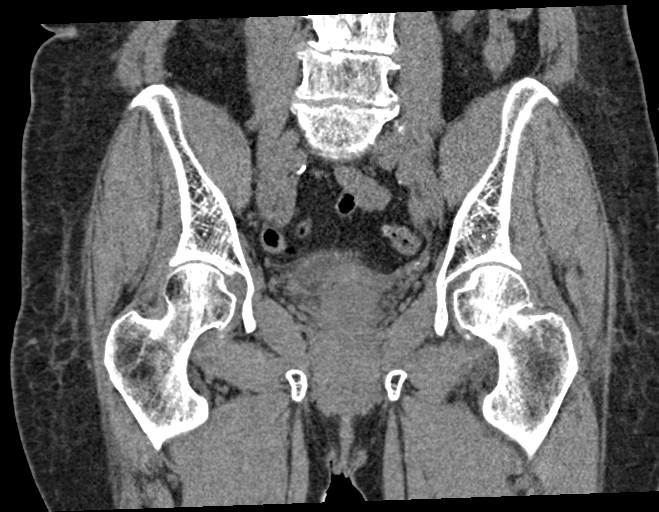

[15 of 46 positions shown; findings below may reference images not displayed]

FINDINGS: Bones/Joint/Cartilage

Unchanged acute mildly impacted subcapital left femoral neck
fracture. No additional fracture. No dislocation. Mild bilateral hip
osteoarthritis. Osteopenia. Small left hip joint effusion.

Degenerative changes of the pubic symphysis and sacroiliac joints.
Severe lower lumbar degenerative disc disease. Advanced lower lumbar
facet arthropathy with grade 1 anterolisthesis at L5-S1.

Ligaments

Ligaments are suboptimally evaluated by CT.

Muscles and Tendons
Grossly intact.  No muscle atrophy.

Soft tissue
No fluid collection or hematoma.  No soft tissue mass.

Sigmoid colonic diverticulosis. Aortoiliac atherosclerotic vascular
disease.
IMPRESSION: 1. Unchanged acute mildly impacted subcapital left femoral neck
fracture.

## 2022-07-20 IMAGING — RF DG HIP (WITH OR WITHOUT PELVIS) 1V*L*
1 series · 2 of 2 positions shown · non-contrast
Comparison: None.

CLINICAL DATA: Left hip arthroplasty.

EXAM:
DG HIP (WITH OR WITHOUT PELVIS) 1V*L*

[Series 1: unknown protocol · 0.20mm/px · 2 of 2 slices shown]
[im 1/2]
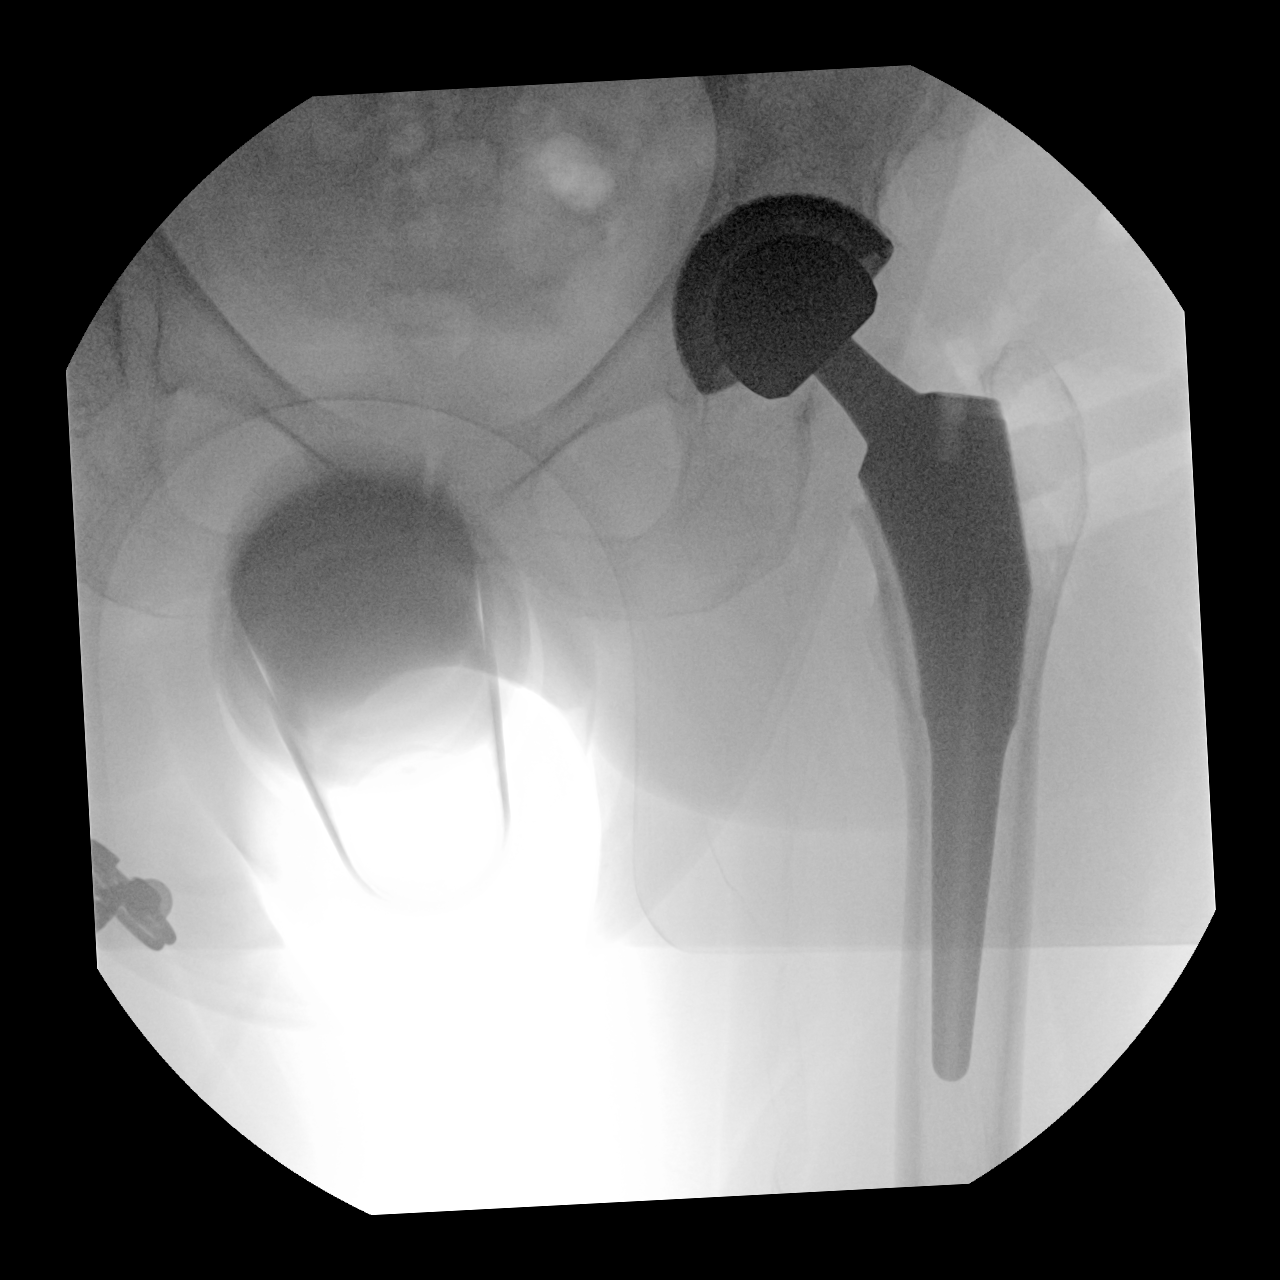
[im 2/2]
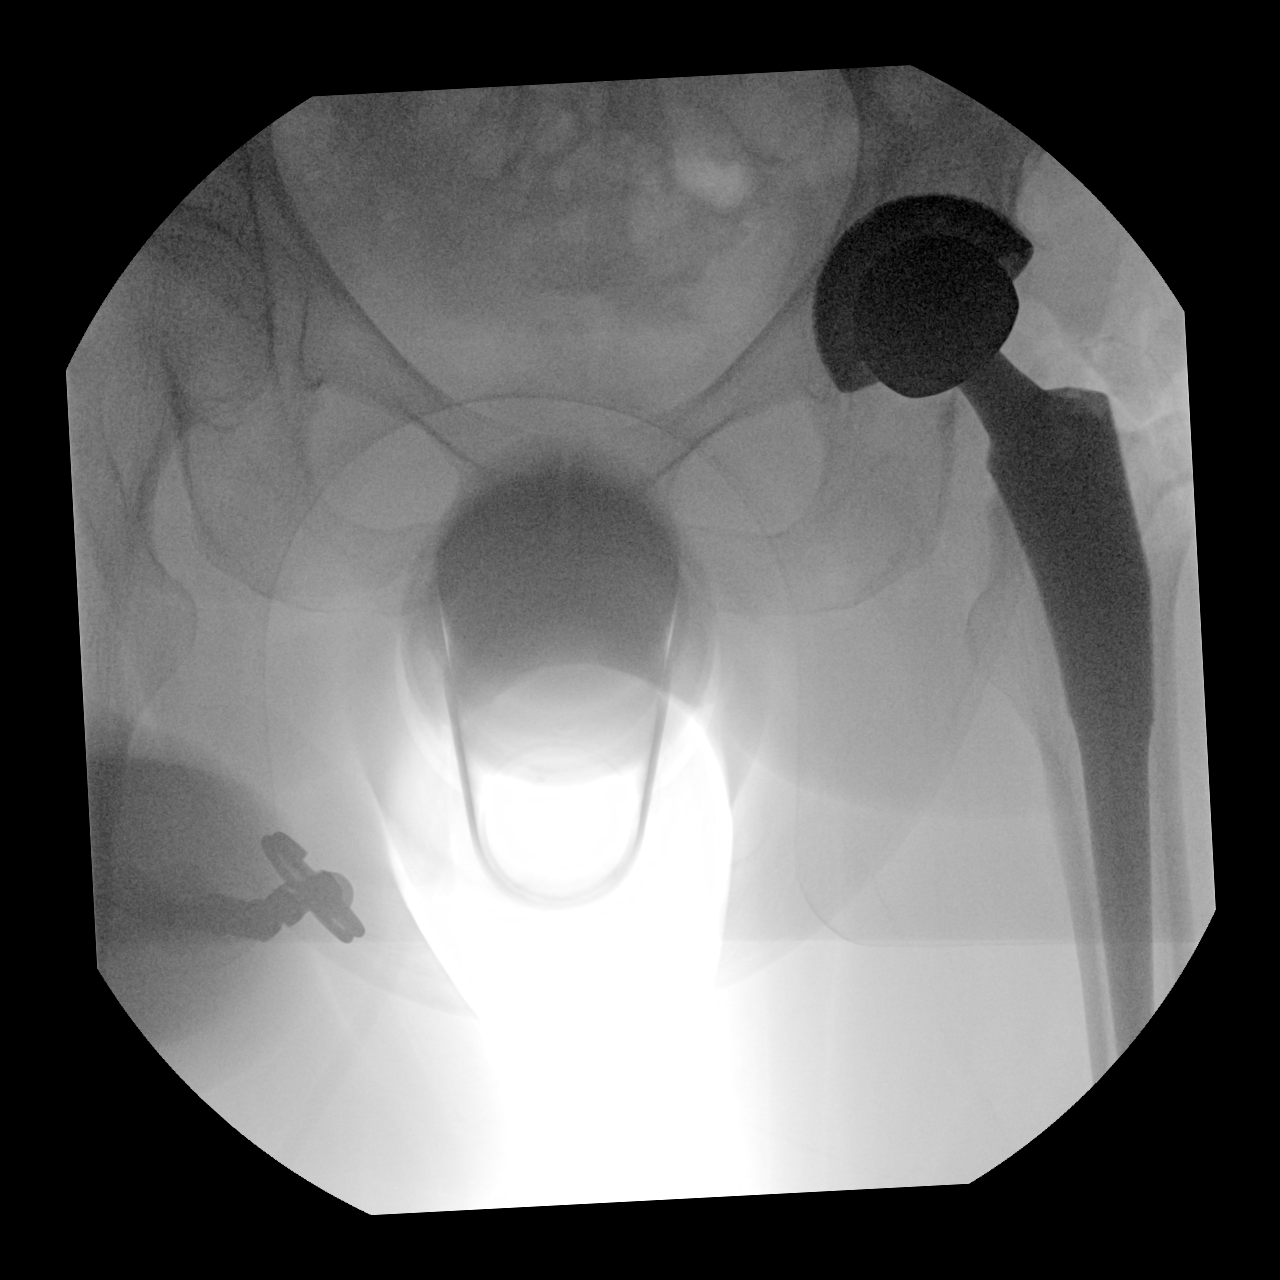

[2 of 2 positions shown; findings below may reference images not displayed]

FINDINGS: Bipolar left hip prosthesis is seen in appropriate position. No
evidence of fracture or dislocation.
IMPRESSION: Unremarkable appearance of bipolar left hip prosthesis. No evidence
of fracture or dislocation.

## 2022-08-19 DIAGNOSIS — M1990 Unspecified osteoarthritis, unspecified site: Secondary | ICD-10-CM | POA: Diagnosis not present

## 2022-08-19 DIAGNOSIS — R252 Cramp and spasm: Secondary | ICD-10-CM | POA: Diagnosis not present

## 2022-08-19 DIAGNOSIS — M2550 Pain in unspecified joint: Secondary | ICD-10-CM | POA: Diagnosis not present

## 2022-08-19 DIAGNOSIS — Z96651 Presence of right artificial knee joint: Secondary | ICD-10-CM | POA: Diagnosis not present

## 2022-08-19 DIAGNOSIS — E559 Vitamin D deficiency, unspecified: Secondary | ICD-10-CM | POA: Diagnosis not present

## 2022-08-19 DIAGNOSIS — I119 Hypertensive heart disease without heart failure: Secondary | ICD-10-CM | POA: Diagnosis not present

## 2022-08-19 DIAGNOSIS — E7800 Pure hypercholesterolemia, unspecified: Secondary | ICD-10-CM | POA: Diagnosis not present

## 2022-08-19 DIAGNOSIS — Z79899 Other long term (current) drug therapy: Secondary | ICD-10-CM | POA: Diagnosis not present

## 2022-08-19 DIAGNOSIS — L299 Pruritus, unspecified: Secondary | ICD-10-CM | POA: Diagnosis not present

## 2022-08-19 DIAGNOSIS — L309 Dermatitis, unspecified: Secondary | ICD-10-CM | POA: Diagnosis not present

## 2022-08-19 DIAGNOSIS — G9389 Other specified disorders of brain: Secondary | ICD-10-CM | POA: Diagnosis not present

## 2022-08-19 DIAGNOSIS — Z8249 Family history of ischemic heart disease and other diseases of the circulatory system: Secondary | ICD-10-CM | POA: Diagnosis not present

## 2022-10-17 DIAGNOSIS — M1712 Unilateral primary osteoarthritis, left knee: Secondary | ICD-10-CM | POA: Diagnosis not present

## 2022-11-10 DIAGNOSIS — Z1231 Encounter for screening mammogram for malignant neoplasm of breast: Secondary | ICD-10-CM | POA: Diagnosis not present

## 2022-11-25 DIAGNOSIS — Z96642 Presence of left artificial hip joint: Secondary | ICD-10-CM | POA: Diagnosis not present

## 2022-11-25 DIAGNOSIS — E559 Vitamin D deficiency, unspecified: Secondary | ICD-10-CM | POA: Diagnosis not present

## 2022-11-25 DIAGNOSIS — Z23 Encounter for immunization: Secondary | ICD-10-CM | POA: Diagnosis not present

## 2022-11-25 DIAGNOSIS — I10 Essential (primary) hypertension: Secondary | ICD-10-CM | POA: Diagnosis not present

## 2022-11-25 DIAGNOSIS — Z96651 Presence of right artificial knee joint: Secondary | ICD-10-CM | POA: Diagnosis not present

## 2022-11-25 DIAGNOSIS — M2550 Pain in unspecified joint: Secondary | ICD-10-CM | POA: Diagnosis not present

## 2022-11-25 DIAGNOSIS — E7800 Pure hypercholesterolemia, unspecified: Secondary | ICD-10-CM | POA: Diagnosis not present

## 2022-11-25 DIAGNOSIS — Z8249 Family history of ischemic heart disease and other diseases of the circulatory system: Secondary | ICD-10-CM | POA: Diagnosis not present

## 2022-11-25 DIAGNOSIS — M1990 Unspecified osteoarthritis, unspecified site: Secondary | ICD-10-CM | POA: Diagnosis not present

## 2022-11-25 DIAGNOSIS — Z0001 Encounter for general adult medical examination with abnormal findings: Secondary | ICD-10-CM | POA: Diagnosis not present

## 2022-11-25 DIAGNOSIS — Z1331 Encounter for screening for depression: Secondary | ICD-10-CM | POA: Diagnosis not present

## 2022-11-25 DIAGNOSIS — G9389 Other specified disorders of brain: Secondary | ICD-10-CM | POA: Diagnosis not present

## 2023-01-02 DIAGNOSIS — M1712 Unilateral primary osteoarthritis, left knee: Secondary | ICD-10-CM | POA: Diagnosis not present

## 2023-03-24 DIAGNOSIS — E559 Vitamin D deficiency, unspecified: Secondary | ICD-10-CM | POA: Diagnosis not present

## 2023-03-24 DIAGNOSIS — E7800 Pure hypercholesterolemia, unspecified: Secondary | ICD-10-CM | POA: Diagnosis not present

## 2023-03-24 DIAGNOSIS — I119 Hypertensive heart disease without heart failure: Secondary | ICD-10-CM | POA: Diagnosis not present

## 2023-03-24 DIAGNOSIS — Z79899 Other long term (current) drug therapy: Secondary | ICD-10-CM | POA: Diagnosis not present

## 2023-04-21 DIAGNOSIS — M1712 Unilateral primary osteoarthritis, left knee: Secondary | ICD-10-CM | POA: Diagnosis not present

## 2023-04-22 DIAGNOSIS — H25813 Combined forms of age-related cataract, bilateral: Secondary | ICD-10-CM | POA: Diagnosis not present

## 2023-05-04 DIAGNOSIS — Z01419 Encounter for gynecological examination (general) (routine) without abnormal findings: Secondary | ICD-10-CM | POA: Diagnosis not present

## 2023-05-04 DIAGNOSIS — Z13820 Encounter for screening for osteoporosis: Secondary | ICD-10-CM | POA: Diagnosis not present

## 2023-05-12 DIAGNOSIS — M810 Age-related osteoporosis without current pathological fracture: Secondary | ICD-10-CM | POA: Diagnosis not present

## 2023-07-07 DIAGNOSIS — M1712 Unilateral primary osteoarthritis, left knee: Secondary | ICD-10-CM | POA: Diagnosis not present

## 2023-07-07 DIAGNOSIS — M7632 Iliotibial band syndrome, left leg: Secondary | ICD-10-CM | POA: Diagnosis not present

## 2023-07-07 DIAGNOSIS — Z96642 Presence of left artificial hip joint: Secondary | ICD-10-CM | POA: Diagnosis not present

## 2023-07-15 DIAGNOSIS — M25562 Pain in left knee: Secondary | ICD-10-CM | POA: Diagnosis not present

## 2023-07-15 DIAGNOSIS — M1712 Unilateral primary osteoarthritis, left knee: Secondary | ICD-10-CM | POA: Diagnosis not present

## 2023-07-17 DIAGNOSIS — M25562 Pain in left knee: Secondary | ICD-10-CM | POA: Diagnosis not present

## 2023-07-17 DIAGNOSIS — M1712 Unilateral primary osteoarthritis, left knee: Secondary | ICD-10-CM | POA: Diagnosis not present

## 2023-07-22 DIAGNOSIS — M25562 Pain in left knee: Secondary | ICD-10-CM | POA: Diagnosis not present

## 2023-07-22 DIAGNOSIS — M1712 Unilateral primary osteoarthritis, left knee: Secondary | ICD-10-CM | POA: Diagnosis not present

## 2023-07-27 DIAGNOSIS — M25562 Pain in left knee: Secondary | ICD-10-CM | POA: Diagnosis not present

## 2023-07-27 DIAGNOSIS — M1712 Unilateral primary osteoarthritis, left knee: Secondary | ICD-10-CM | POA: Diagnosis not present

## 2023-07-30 DIAGNOSIS — M25562 Pain in left knee: Secondary | ICD-10-CM | POA: Diagnosis not present

## 2023-07-30 DIAGNOSIS — M1712 Unilateral primary osteoarthritis, left knee: Secondary | ICD-10-CM | POA: Diagnosis not present

## 2023-08-04 DIAGNOSIS — M1712 Unilateral primary osteoarthritis, left knee: Secondary | ICD-10-CM | POA: Diagnosis not present

## 2023-08-04 DIAGNOSIS — M25562 Pain in left knee: Secondary | ICD-10-CM | POA: Diagnosis not present

## 2023-08-07 DIAGNOSIS — M1712 Unilateral primary osteoarthritis, left knee: Secondary | ICD-10-CM | POA: Diagnosis not present

## 2023-08-07 DIAGNOSIS — M25562 Pain in left knee: Secondary | ICD-10-CM | POA: Diagnosis not present

## 2023-09-07 DIAGNOSIS — M170 Bilateral primary osteoarthritis of knee: Secondary | ICD-10-CM | POA: Diagnosis not present

## 2023-09-07 DIAGNOSIS — M1712 Unilateral primary osteoarthritis, left knee: Secondary | ICD-10-CM | POA: Diagnosis not present

## 2023-09-07 DIAGNOSIS — M1711 Unilateral primary osteoarthritis, right knee: Secondary | ICD-10-CM | POA: Diagnosis not present

## 2023-09-07 DIAGNOSIS — Z96652 Presence of left artificial knee joint: Secondary | ICD-10-CM | POA: Diagnosis not present

## 2023-09-07 DIAGNOSIS — Z471 Aftercare following joint replacement surgery: Secondary | ICD-10-CM | POA: Diagnosis not present

## 2023-10-27 DIAGNOSIS — M1712 Unilateral primary osteoarthritis, left knee: Secondary | ICD-10-CM | POA: Diagnosis not present

## 2023-11-03 DIAGNOSIS — M1712 Unilateral primary osteoarthritis, left knee: Secondary | ICD-10-CM | POA: Diagnosis not present

## 2023-11-10 DIAGNOSIS — M1712 Unilateral primary osteoarthritis, left knee: Secondary | ICD-10-CM | POA: Diagnosis not present

## 2023-11-24 DIAGNOSIS — N644 Mastodynia: Secondary | ICD-10-CM | POA: Diagnosis not present

## 2023-11-24 DIAGNOSIS — R928 Other abnormal and inconclusive findings on diagnostic imaging of breast: Secondary | ICD-10-CM | POA: Diagnosis not present

## 2023-12-01 ENCOUNTER — Ambulatory Visit

## 2023-12-01 DIAGNOSIS — Z23 Encounter for immunization: Secondary | ICD-10-CM

## 2023-12-14 DIAGNOSIS — R252 Cramp and spasm: Secondary | ICD-10-CM | POA: Diagnosis not present

## 2023-12-14 DIAGNOSIS — E559 Vitamin D deficiency, unspecified: Secondary | ICD-10-CM | POA: Diagnosis not present

## 2023-12-14 DIAGNOSIS — Z79899 Other long term (current) drug therapy: Secondary | ICD-10-CM | POA: Diagnosis not present

## 2023-12-14 DIAGNOSIS — I119 Hypertensive heart disease without heart failure: Secondary | ICD-10-CM | POA: Diagnosis not present

## 2023-12-14 DIAGNOSIS — E7800 Pure hypercholesterolemia, unspecified: Secondary | ICD-10-CM | POA: Diagnosis not present
